# Patient Record
Sex: Male | Born: 1995 | Race: Black or African American | Hispanic: No | Marital: Single | Smoking: Never smoker
Health system: Southern US, Community
[De-identification: ages and names within clinical notes are randomized; demographics above are authoritative.]

## PROBLEM LIST (undated history)

## (undated) DIAGNOSIS — J302 Other seasonal allergic rhinitis: Secondary | ICD-10-CM

## (undated) DIAGNOSIS — K219 Gastro-esophageal reflux disease without esophagitis: Secondary | ICD-10-CM

## (undated) DIAGNOSIS — T7840XA Allergy, unspecified, initial encounter: Secondary | ICD-10-CM

## (undated) HISTORY — DX: Other seasonal allergic rhinitis: J30.2

## (undated) HISTORY — DX: Allergy, unspecified, initial encounter: T78.40XA

## (undated) HISTORY — PX: NO PAST SURGERIES: SHX2092

## (undated) HISTORY — DX: Gastro-esophageal reflux disease without esophagitis: K21.9

---

## 2017-10-11 ENCOUNTER — Emergency Department (HOSPITAL_COMMUNITY)
Admission: EM | Admit: 2017-10-11 | Discharge: 2017-10-11 | Disposition: A | Discharge status: Home / Self Care | Payer: BLUE CROSS/BLUE SHIELD | Attending: Emergency Medicine | Admitting: Emergency Medicine

## 2017-10-11 ENCOUNTER — Other Ambulatory Visit: Payer: Self-pay

## 2017-10-11 ENCOUNTER — Encounter (HOSPITAL_COMMUNITY): Payer: Self-pay

## 2017-10-11 DIAGNOSIS — J392 Other diseases of pharynx: Secondary | ICD-10-CM

## 2017-10-11 DIAGNOSIS — R0989 Other specified symptoms and signs involving the circulatory and respiratory systems: Secondary | ICD-10-CM | POA: Diagnosis present

## 2017-10-11 MED ORDER — OMEPRAZOLE 20 MG PO CPDR: 20.0000 mg | DELAYED_RELEASE_CAPSULE | Freq: Every day | ORAL | 0 refills | Status: DC

## 2017-10-11 MED ORDER — GI COCKTAIL ~~LOC~~
30.0000 mL | Freq: Once | ORAL | Status: AC
  Administered 2017-10-11: 30 mL via ORAL
  Filled 2017-10-11: qty 30

## 2017-10-11 MED ORDER — ONDANSETRON 4 MG PO TBDP: 4.0000 mg | ORAL_TABLET | Freq: Three times a day (TID) | ORAL | 0 refills | Status: DC | PRN

## 2017-10-11 NOTE — ED Notes (Signed)
One touch patient. See provider assessment notes.

## 2017-10-11 NOTE — ED Triage Notes (Signed)
Per Pt, Pt is coming from home with complaints of chip being stuck in his throat. Reports it getting stuck last Saturday and then the pain went away, but it has returned.

## 2017-10-11 NOTE — Discharge Instructions (Signed)
Take Zofran as needed for nausea. Take Prilosec as needed for reflux and heartburn. Follow[facility] for [facility]. Return to ED for worsening symptoms, trouble breathing, trouble swallowing, increased vomiting, or chest pain.

## 2017-10-11 NOTE — ED Provider Notes (Signed)
[facility] Provider [facility]   CSN: [id number] Arrival date & time: 10/11/17  1028     History   Chief Complaint Chief Complaint  Patient presents with  . Foreign Body in Throat    HPI Gary Boyd is a 21 y.o. male with no significant past medical history, who presents to ED for evaluation of foreign body sensation in throat for the past week.  He states that 1 week ago he was eating corn chips and felt like there was a small piece stuck in his throat.  Since then his symptoms have improved but then returned since last night.  He has not eating anything sharp or hard since then.  He states that he is able to drink liquids.  He also reports intermittent regurgitation and heartburn sensation.  He has not had any over-the-counter medications to help with symptoms.  He denies any previous history of similar symptoms, history of endoscopies, bowel changes, fever, trouble breathing, trouble swallowing, drooling.  HPI  History reviewed. No pertinent past medical history.  There are no active problems to display for this patient.   History reviewed. No pertinent surgical history.     Home Medications    Prior to Admission medications   Medication Sig Start Date End Date Taking? Authorizing Provider  omeprazole (PRILOSEC) 20 MG capsule Take 1 capsule (20 mg total) by mouth daily. 10/11/17   Rolande Moe, PA-C  ondansetron (ZOFRAN ODT) 4 MG disintegrating tablet Take 1 tablet (4 mg total) by mouth every 8 (eight) hours as needed for nausea or vomiting. 10/11/17   Dietrich PatesKhatri, Slade Pierpoint, PA-C    Family History No family history on file.  Social History Social History   Tobacco Use  . Smoking status: Never Smoker  . Smokeless tobacco: Never Used  Substance Use Topics  . Alcohol use: Yes    Frequency: Never  . Drug use: No     Allergies   Patient has no known allergies.   Review of Systems Review of Systems  Constitutional: Negative for  chills and fever.  HENT: Negative for drooling, facial swelling, sore throat and trouble swallowing.   Respiratory: Negative for choking and shortness of breath.   Gastrointestinal: Positive for nausea. Negative for vomiting.       + Foreign body sensation in throat + Reflux/heartburn     Physical Exam Updated Vital Signs BP 128/86 (BP Location: Right Arm)   Pulse 66   Temp 99 F (37.2 C) (Oral)   Resp 18   Ht 5\' 5"  (1.651 m)   Wt 80.7 kg (178 lb)   SpO2 100%   BMI 29.62 kg/m   Physical Exam  Constitutional: He appears well-developed and well-nourished. No distress.  Nontoxic appearing and in no acute distress.  Speaking in complete sentences with no signs of airway compromise or respiratory distress.  HENT:  Head: Normocephalic and atraumatic.  Mouth/Throat: Uvula is midline. No oral lesions. No dental abscesses. No posterior oropharyngeal edema or posterior oropharyngeal erythema. No tonsillar exudate.  Eyes: Conjunctivae and EOM are normal. No scleral icterus.  Neck: Normal range of motion.  Cardiovascular: Normal rate, regular rhythm and normal heart sounds.  Pulmonary/Chest: Effort normal and breath sounds normal. No respiratory distress.  Abdominal: Soft. Bowel sounds are normal. There is no tenderness.  Neurological: He is alert.  Skin: No rash noted. He is not diaphoretic.  Psychiatric: He has a normal mood and affect.  Nursing note and vitals reviewed.    ED  Treatments / Results  Labs (all labs ordered are listed, but only abnormal results are displayed) Labs Reviewed - No data to display  EKG  EKG Interpretation None       Radiology No results found.  Procedures Procedures (including critical care time)  Medications Ordered in ED Medications  gi cocktail (Maalox,Lidocaine,Donnatal) (30 mLs Oral Given 10/11/17 1219)     Initial Impression / Assessment and Plan / ED Course  I have reviewed the triage vital signs and the nursing  notes.  Pertinent labs & imaging results that were available during my care of the patient were reviewed by me and considered in my medical decision making (see chart for details).     Patient presents to ED for evaluation of foreign body sensation in throat, as well as reflux.  States sensation has been intermittent for about a week.  On physical exam patient is overall well-appearing.  He is speaking in complete sentences with no signs of respiratory distress or airway compromise.  SpO2 is 100% on room air. He is able to tolerate p.o. intake here with no problems.  Patient reports complete resolution of his symptoms with a GI cocktail.  Since his symptoms have been going on for about a week, I suspect that his symptoms are due to esophageal irritation rather than a foreign body considering the timeline and is ability to tolerate PO intake.  Will discharge home with Zofran to be taken as needed for nausea as well as medications for reflux.  [facility]  Final Clinical Impressions(s) / ED Diagnoses   Final diagnoses:  Throat irritation    ED Discharge Orders        Ordered    ondansetron (ZOFRAN ODT) 4 MG disintegrating tablet  Every 8 hours PRN     10/11/17 1257    omeprazole (PRILOSEC) 20 MG capsule  Daily     10/11/17 1257       Dietrich PatesKhatri, Natori Gudino, PA-C 10/11/17 1306    Gerhard MunchLockwood, Robert, MD 10/13/17 [phone]

## 2018-01-01 ENCOUNTER — Ambulatory Visit (INDEPENDENT_AMBULATORY_CARE_PROVIDER_SITE_OTHER): Payer: BLUE CROSS/BLUE SHIELD | Admitting: Physician Assistant

## 2018-01-01 ENCOUNTER — Encounter: Payer: Self-pay | Admitting: Physician Assistant

## 2018-01-01 ENCOUNTER — Encounter: Payer: Self-pay | Admitting: Internal Medicine

## 2018-01-01 VITALS — BP 122/72 | HR 79 | Temp 97.2°F | Resp 17 | Ht 69.5 in | Wt 180.0 lb

## 2018-01-01 DIAGNOSIS — R1310 Dysphagia, unspecified: Secondary | ICD-10-CM

## 2018-01-01 NOTE — Progress Notes (Signed)
   Gary Boyd  MRN: [id number] DOB: [birth year]  PCP: Patient, No Pcp Per  Subjective:  Pt is a pleasant 22 year old male who complains of difficulty swallowing. The dysphagia occurs with solids. Symptoms have been present for approximately 3 months. The symptoms are unchanged. The dysphagia has been persistent and has not been progressive. Feels like food gets stuck at the upper/center of his chest.  He c/o no indigestion or heartburn.  He denies melena, hematochezia, hematemesis, and coffee ground emesis.  This has been associated with dysphagia, midespigastric pain and shortness of breath. He endorses a few episodes of undigested food "coming back up after a few hours". He denies difficulty initiating swallowing, weight loss, abdominal bloating, belching and eructation, choking on food, cough, early satiety, fullness after meals, heartburn, hoarseness, nausea, need to clear throat frequently and unexpected weight loss. He has never had this problem before.   Review of Systems  Constitutional: Negative for appetite change, chills, diaphoresis, fatigue and unexpected weight change.  HENT: Positive for trouble swallowing.   Gastrointestinal: Positive for vomiting. Negative for abdominal pain, diarrhea and nausea.  Neurological: Negative for speech difficulty.    There are no active problems to display for this patient.   No current outpatient medications on file prior to visit.   No current facility-administered medications on file prior to visit.     No Known Allergies   Objective:  BP 122/72   Pulse 79   Temp (!) 97.2 F (36.2 C) (Oral)   Resp 17   Ht 5' 9.5" (1.765 m)   Wt 180 lb (81.6 kg)   SpO2 98%   BMI 26.20 kg/m   Physical Exam  Constitutional: He is oriented to person, place, and time and well-developed, well-nourished, and in no distress. No distress.  Neck: Normal range of motion and full passive range of motion without pain. Neck supple.  Cardiovascular:  Normal rate, regular rhythm and normal heart sounds.  Pulmonary/Chest: Effort normal.  Abdominal: Soft. Bowel sounds are normal. There is no tenderness.  Neurological: He is alert and oriented to person, place, and time. GCS score is 15.  Skin: Skin is warm and dry.  Psychiatric: Mood, memory, affect and judgment normal.  Vitals reviewed.   Assessment and Plan :  1. Dysphagia, unspecified type - Ambulatory referral to Gastroenterology - Pt c/o difficulty swallowing for the past 3 months. Symptoms are not progressive and I do not appreciate any red flag symptoms. He was seen in the ED 10/11/2017 for FB sensation in throat where symptoms improved with GI cocktail. He was sent home with zofran. No imaging performed.  Plan to refer to GI for evaluation and work-up. I feel he would benefit from imaging studies. Advised pt to keep food/symptom log and avoid foods which significantly worsen symptoms.   Marco CollieWhitney Dolorez Jeffrey, PA-C  [facility] at [facility] 01/01/2018 1:53 PM

## 2018-01-01 NOTE — Patient Instructions (Addendum)
You will receive a phone call to schedule an appointment with GI.  If you can, try to start keeping a log to note specific information regarding your symptoms (ie specific foods causing symptoms, time of day, etc) This will help the provider you see.   Thank you for coming in today. I hope you feel we met your needs.  Feel free to call PCP if you have any questions or further requests.  Please consider signing up for MyChart if you do not already have it, as this is a great way to communicate with me.  Best,  Whitney McVey, PA-C'   IF you received an x-ray today, you will receive an invoice from [facility]. Please contact [facility] at [phone] with questions or concerns regarding your invoice.   IF you received labwork today, you will receive an invoice from [facility]. Please contact [facility] at [phone] with questions or concerns regarding your invoice.   Our billing staff will not be able to assist you with questions regarding bills from these companies.  You will be contacted with the lab results as soon as they are available. The fastest way to get your results is to activate your My Chart account. Instructions are located on the last page of this paperwork. If you have not heard from [place] regarding the results in 2 weeks, please contact this office.

## 2018-01-05 ENCOUNTER — Emergency Department (HOSPITAL_COMMUNITY)
Admission: EM | Admit: 2018-01-05 | Discharge: 2018-01-05 | Disposition: A | Discharge status: Home / Self Care | Payer: BLUE CROSS/BLUE SHIELD | Attending: Emergency Medicine | Admitting: Emergency Medicine

## 2018-01-05 ENCOUNTER — Encounter (HOSPITAL_COMMUNITY): Payer: Self-pay

## 2018-01-05 ENCOUNTER — Other Ambulatory Visit: Payer: Self-pay

## 2018-01-05 ENCOUNTER — Emergency Department (HOSPITAL_COMMUNITY): Payer: BLUE CROSS/BLUE SHIELD

## 2018-01-05 DIAGNOSIS — R079 Chest pain, unspecified: Secondary | ICD-10-CM | POA: Diagnosis present

## 2018-01-05 DIAGNOSIS — R0789 Other chest pain: Secondary | ICD-10-CM | POA: Diagnosis not present

## 2018-01-05 LAB — CBC
HCT: 42.3 % (ref 39.0–52.0)
HEMOGLOBIN: 14.3 g/dL (ref 13.0–17.0)
MCH: 30.1 pg (ref 26.0–34.0)
MCHC: 33.8 g/dL (ref 30.0–36.0)
MCV: 89.1 fL (ref 78.0–100.0)
Platelets: 209 10*3/uL (ref 150–400)
RBC: 4.75 MIL/uL (ref 4.22–5.81)
RDW: 12.3 % (ref 11.5–15.5)
WBC: 6.7 10*3/uL (ref 4.0–10.5)

## 2018-01-05 LAB — BASIC METABOLIC PANEL
ANION GAP: 11 (ref 5–15)
BUN: 7 mg/dL (ref 6–20)
CHLORIDE: 102 mmol/L (ref 101–111)
CO2: 24 mmol/L (ref 22–32)
Calcium: 10 mg/dL (ref 8.9–10.3)
Creatinine, Ser: 0.93 mg/dL (ref 0.61–1.24)
GFR calc non Af Amer: >60 mL/min (ref >60)
GLUCOSE: 89 mg/dL (ref 65–99)
POTASSIUM: 3.7 mmol/L (ref 3.5–5.1)
Sodium: 137 mmol/L (ref 135–145)

## 2018-01-05 LAB — I-STAT TROPONIN, ED: TROPONIN I, POC: 0 ng/mL (ref 0.00–0.08)

## 2018-01-05 MED ORDER — GI COCKTAIL ~~LOC~~
30.0000 mL | Freq: Once | ORAL | Status: AC
  Administered 2018-01-05: 30 mL via ORAL
  Filled 2018-01-05: qty 30

## 2018-01-05 MED ORDER — FAMOTIDINE 20 MG PO TABS: 20.0000 mg | ORAL_TABLET | Freq: Two times a day (BID) | ORAL | 0 refills | Status: DC

## 2018-01-05 MED ORDER — ONDANSETRON 4 MG PO TBDP: 4.0000 mg | ORAL_TABLET | Freq: Three times a day (TID) | ORAL | 0 refills | Status: DC | PRN

## 2018-01-05 MED ORDER — ONDANSETRON 4 MG PO TBDP
4.0000 mg | ORAL_TABLET | Freq: Once | ORAL | Status: AC
  Administered 2018-01-05: 4 mg via ORAL
  Filled 2018-01-05: qty 1

## 2018-01-05 NOTE — ED Triage Notes (Signed)
Pt states for the past month he has been having chest congestion, central pain, nasal congestion and feeling SOB, vomited x 3.

## 2018-01-05 NOTE — Discharge Instructions (Signed)
Start the pepcid daily, reserve zofran use only if you feel really nauseated. Follow-up with your primary care doctor. Return to the ED for new or worsening symptoms.

## 2018-01-05 NOTE — ED Provider Notes (Signed)
[facility]   CSN: [id number] Arrival date & time: 01/05/18  0451     History   Chief Complaint Chief Complaint  Patient presents with  . Chest Pain    HPI Gary Boyd is a [age] y.o. male.  The history is provided by the patient and medical records.  Chest Pain   Associated symptoms include nausea.    22 year old male presenting to the ED with chest pain.  Reports for the past month he has had some chest congestion, central chest pain that he describes as burning as well as some intermittent shortness of breath.  States whenever he eats he feels like stuff gets "hung" in his throat.  States he was seen in the ED for this about a month ago was started on medication but once he stopped the medicine his symptoms all returned.  This mostly occurs with solid food, less so with liquids.  He has not had any issues swallowing his saliva.  He has not had any fever or chills.  Does report some intermittent episodes of emesis when he gets that sensation in his throat.  He has no known cardiac history.  He is not a smoker.  No significant family cardiac history.  History reviewed. No pertinent past medical history.  There are no active problems to display for this patient.   History reviewed. No pertinent surgical history.     Home Medications    Prior to Admission medications   Not on File    Family History No family history on file.  Social History Social History   Tobacco Use  . Smoking status: Never Smoker  . Smokeless tobacco: Never Used  Substance Use Topics  . Alcohol use: Yes    Frequency: Never  . Drug use: No     Allergies   Patient has no known allergies.   Review of Systems Review of Systems  Cardiovascular: Positive for chest pain.  Gastrointestinal: Positive for nausea.  All other systems reviewed and are negative.    Physical Exam Updated Vital Signs BP 124/76   Pulse 73   Temp 97.6 F  (36.4 C) (Oral)   Resp 18   Ht 5\' 9"  (1.753 m)   Wt 81.6 kg (180 lb)   SpO2 100%   BMI 26.58 kg/m   Physical Exam  Constitutional: He is oriented to person, place, and time. He appears well-developed and well-nourished.  HENT:  Head: Normocephalic and atraumatic.  Mouth/Throat: Oropharynx is clear and moist.  Airway patent, handling secretions well, normal phonation without stridor; no tonsillar edema or exudates  Eyes: Conjunctivae and EOM are normal. Pupils are equal, round, and reactive to light.  Neck: Normal range of motion.  Cardiovascular: Normal rate, regular rhythm and normal heart sounds.  Pulmonary/Chest: Effort normal and breath sounds normal. He has no decreased breath sounds. He has no wheezes.  Abdominal: Soft. Bowel sounds are normal.  Musculoskeletal: Normal range of motion.  Neurological: He is alert and oriented to person, place, and time.  Skin: Skin is warm and dry.  Psychiatric: He has a normal mood and affect.  Nursing note and vitals reviewed.    ED Treatments / Results  Labs (all labs ordered are listed, but only abnormal results are displayed) Labs Reviewed  BASIC METABOLIC PANEL  CBC  I-STAT TROPONIN, ED    EKG  EKG Interpretation None       Radiology Dg Chest 2 View  Result Date: 01/05/2018 CLINICAL  DATA:  22 y/o M; chest congestion, central chest pain, shortness of breath, and multiple episodes of vomiting for the past month. EXAM: CHEST  2 VIEW COMPARISON:  None. FINDINGS: The heart size and mediastinal contours are within normal limits. Both lungs are clear. The visualized skeletal structures are unremarkable. IMPRESSION: No acute pulmonary process identified. Electronically Signed   By: Mitzi Hansen M.D.   On: 01/05/2018 06:02    Procedures Procedures (including critical care time)  Medications Ordered in ED Medications  ondansetron (ZOFRAN-ODT) disintegrating tablet 4 mg (4 mg Oral Given 01/05/18 0618)  gi cocktail  (Maalox,Lidocaine,Donnatal) (30 mLs Oral Given 01/05/18 1610)     Initial Impression / Assessment and Plan / ED Course  I have reviewed the triage vital signs and the nursing notes.  Pertinent labs & imaging results that were available during my care of the patient were reviewed by me and considered in my medical decision making (see chart for details).  22 y.o. M here with chest pain.  Sensation of food getting "hung" up in the throat over the past few weeks as well as burning sensation in the chest.  Was on meds for this before and better controlled, symptoms returned once stopped.  EKG without acute ischemic changes.  Labs reassuring.  CXR clear.  Patient treated here with zofran and GI cocktail and reports feeling better.  Suspect his symptoms related to GERD.  He has not had any issues tolerating PO here.  No findings on exam or x-ray to suggest acute esophageal obstruction.  Will re-start pepcid, discussed limiting spicy/acidic foods.  Close follow-up with PCP.  Discussed plan with patient, he acknowledged understanding and agreed with plan of care.  Return precautions given for new or worsening symptoms.  Final Clinical Impressions(s) / ED Diagnoses   Final diagnoses:  Atypical chest pain    ED Discharge Orders        Ordered    famotidine (PEPCID) 20 MG tablet  2 times daily     01/05/18 0643    ondansetron (ZOFRAN ODT) 4 MG disintegrating tablet  Every 8 hours PRN     01/05/18 0644       Garlon Hatchet, PA-C 01/05/18 9604    Azalia Bilis, MD 01/05/18 [phone]

## 2018-01-19 ENCOUNTER — Encounter (HOSPITAL_COMMUNITY): Payer: Self-pay | Admitting: *Deleted

## 2018-01-19 ENCOUNTER — Emergency Department (HOSPITAL_COMMUNITY)
Admission: EM | Admit: 2018-01-19 | Discharge: 2018-01-19 | Disposition: A | Discharge status: Home / Self Care | Payer: BLUE CROSS/BLUE SHIELD | Attending: Emergency Medicine | Admitting: Emergency Medicine

## 2018-01-19 ENCOUNTER — Other Ambulatory Visit: Payer: Self-pay

## 2018-01-19 DIAGNOSIS — R0789 Other chest pain: Secondary | ICD-10-CM

## 2018-01-19 DIAGNOSIS — K209 Esophagitis, unspecified: Secondary | ICD-10-CM | POA: Diagnosis not present

## 2018-01-19 DIAGNOSIS — K2100 Gastro-esophageal reflux disease with esophagitis, without bleeding: Secondary | ICD-10-CM

## 2018-01-19 DIAGNOSIS — K219 Gastro-esophageal reflux disease without esophagitis: Secondary | ICD-10-CM | POA: Diagnosis present

## 2018-01-19 LAB — COMPREHENSIVE METABOLIC PANEL
ALT: 14 U/L — ABNORMAL LOW (ref 17–63)
ANION GAP: 9 (ref 5–15)
AST: 16 U/L (ref 15–41)
Albumin: 4.4 g/dL (ref 3.5–5.0)
Alkaline Phosphatase: 54 U/L (ref 38–126)
BILIRUBIN TOTAL: 1.1 mg/dL (ref 0.3–1.2)
BUN: <5 mg/dL — ABNORMAL LOW (ref 6–20)
CHLORIDE: 99 mmol/L — AB (ref 101–111)
CO2: 25 mmol/L (ref 22–32)
Calcium: 9 mg/dL (ref 8.9–10.3)
Creatinine, Ser: 0.93 mg/dL (ref 0.61–1.24)
Glucose, Bld: 86 mg/dL (ref 65–99)
POTASSIUM: 3.1 mmol/L — AB (ref 3.5–5.1)
Sodium: 133 mmol/L — ABNORMAL LOW (ref 135–145)
Total Protein: 6.6 g/dL (ref 6.5–8.1)

## 2018-01-19 LAB — URINALYSIS, ROUTINE W REFLEX MICROSCOPIC
Bilirubin Urine: NEGATIVE
GLUCOSE, UA: NEGATIVE mg/dL
Hgb urine dipstick: NEGATIVE
KETONES UR: NEGATIVE mg/dL
LEUKOCYTES UA: NEGATIVE
Nitrite: NEGATIVE
PH: 7 (ref 5.0–8.0)
Protein, ur: NEGATIVE mg/dL
Specific Gravity, Urine: 1 — ABNORMAL LOW (ref 1.005–1.030)

## 2018-01-19 LAB — CBC
HEMATOCRIT: 37.7 % — AB (ref 39.0–52.0)
HEMOGLOBIN: 12.5 g/dL — AB (ref 13.0–17.0)
MCH: 29.1 pg (ref 26.0–34.0)
MCHC: 33.2 g/dL (ref 30.0–36.0)
MCV: 87.9 fL (ref 78.0–100.0)
Platelets: 217 10*3/uL (ref 150–400)
RBC: 4.29 MIL/uL (ref 4.22–5.81)
RDW: 12.1 % (ref 11.5–15.5)
WBC: 5.3 10*3/uL (ref 4.0–10.5)

## 2018-01-19 LAB — LIPASE, BLOOD: LIPASE: 27 U/L (ref 11–51)

## 2018-01-19 MED ORDER — PANTOPRAZOLE SODIUM 40 MG PO TBEC
40.0000 mg | DELAYED_RELEASE_TABLET | Freq: Once | ORAL | Status: AC
  Administered 2018-01-19: 40 mg via ORAL
  Filled 2018-01-19: qty 1

## 2018-01-19 MED ORDER — FAMOTIDINE 20 MG PO TABS: 20.0000 mg | ORAL_TABLET | Freq: Two times a day (BID) | ORAL | 0 refills | Status: DC

## 2018-01-19 MED ORDER — GI COCKTAIL ~~LOC~~
30.0000 mL | Freq: Once | ORAL | Status: AC
  Administered 2018-01-19: 30 mL via ORAL
  Filled 2018-01-19: qty 30

## 2018-01-19 MED ORDER — SUCRALFATE 1 G PO TABS: 1.0000 g | ORAL_TABLET | Freq: Two times a day (BID) | ORAL | 0 refills | Status: DC

## 2018-01-19 MED ORDER — KETOROLAC TROMETHAMINE 60 MG/2ML IM SOLN
60.0000 mg | Freq: Once | INTRAMUSCULAR | Status: AC
  Administered 2018-01-19: 60 mg via INTRAMUSCULAR
  Filled 2018-01-19: qty 2

## 2018-01-19 NOTE — Discharge Instructions (Signed)
Return here as needed.  Follow-up with the GI doctor provided.  Your testing here today did not show any significant abnormality

## 2018-01-19 NOTE — ED Triage Notes (Signed)
Pt reports pancreatic pain while pointing to chest of chest and upper abd pain. Hx of acid reflux. Reports drinking a tea tonight prior to episode

## 2018-01-19 NOTE — ED Provider Notes (Signed)
[facility]   CSN: [id number] Arrival date & time: 01/19/18  0120     History   Chief Complaint Chief Complaint  Patient presents with  . Gastroesophageal Reflux    HPI Bard HerbertDallas Deshmukh is a 22 y.o. male.  HPI  Patient presents to the emergency department with chest discomfort that started after drinking some tea.  The patient states that chest discomfort that started after drinking some tea.  Patient states that he did not take any medications prior to arrival.  Patient states that he has had similar episodes in the past.  He states certain movements make the pain worse.  Patient states the pain is somewhat sharp/burning.  The patient denies chest pain, shortness of breath, headache,blurred vision, neck pain, fever, cough, weakness, numbness, dizziness, anorexia, edema, abdominal pain, nausea, vomiting, diarrhea, rash, back pain, dysuria, hematemesis, bloody stool, near syncope, or syncope.    History reviewed. No pertinent past medical history.  There are no active problems to display for this patient.   History reviewed. No pertinent surgical history.     Home Medications    Prior to Admission medications   Medication Sig Start Date End Date Taking? Authorizing Provider  famotidine (PEPCID) 20 MG tablet Take 1 tablet (20 mg total) by mouth 2 (two) times daily. 01/05/18   Garlon HatchetSanders, Lisa M, PA-C  ondansetron (ZOFRAN ODT) 4 MG disintegrating tablet Take 1 tablet (4 mg total) by mouth every 8 (eight) hours as needed for nausea. 01/05/18   Garlon HatchetSanders, Lisa M, PA-C    Family History No family history on file.  Social History Social History   Tobacco Use  . Smoking status: Never Smoker  . Smokeless tobacco: Never Used  Substance Use Topics  . Alcohol use: Yes    Frequency: Never  . Drug use: No     Allergies   Patient has no known allergies.   Review of Systems Review of Systems All other systems negative except  as documented in the HPI. All pertinent positives and negatives as reviewed in the HPI.  Physical Exam Updated Vital Signs BP 137/80 (BP Location: Right Arm)   Pulse (!) 57   Temp (!) 97.5 F (36.4 C) (Oral)   Resp 16   Ht 5\' 9"  (1.753 m)   Wt 81.6 kg (180 lb)   SpO2 100%   BMI 26.58 kg/m    Physical Exam  Constitutional: He is oriented to person, place, and time. He appears well-developed and well-nourished. No distress.  HENT:  Head: Normocephalic and atraumatic.  Mouth/Throat: Oropharynx is clear and moist.  Eyes: Pupils are equal, round, and reactive to light.  Neck: Normal range of motion. Neck supple.  Cardiovascular: Normal rate, regular rhythm and normal heart sounds. Exam reveals no gallop and no friction rub.  No murmur heard. Pulmonary/Chest: Effort normal and breath sounds normal. No respiratory distress. He has no wheezes. He exhibits tenderness.  Abdominal: Soft. Bowel sounds are normal. He exhibits no distension. There is no tenderness.  Neurological: He is alert and oriented to person, place, and time. He exhibits normal muscle tone. Coordination normal.  Skin: Skin is warm and dry. Capillary refill takes less than 2 seconds. No rash noted. No erythema.  Psychiatric: He has a normal mood and affect. His behavior is normal.  Nursing note and vitals reviewed.    ED Treatments / Results  Labs (all labs ordered are listed, but only abnormal results are displayed) Labs Reviewed  COMPREHENSIVE  METABOLIC PANEL - Abnormal; Notable for the following components:      Result Value   Sodium 133 (*)    Potassium 3.1 (*)    Chloride 99 (*)    BUN <5 (*)    ALT 14 (*)    All other components within normal limits  CBC - Abnormal; Notable for the following components:   Hemoglobin 12.5 (*)    HCT 37.7 (*)    All other components within normal limits  URINALYSIS, ROUTINE W REFLEX MICROSCOPIC - Abnormal; Notable for the following components:   Color, Urine COLORLESS (*)     Specific Gravity, Urine 1.000 (*)    All other components within normal limits  LIPASE, BLOOD    EKG  EKG Interpretation None       Radiology No results found.  Procedures Procedures (including critical care time)  Medications Ordered in ED Medications  gi cocktail (Maalox,Lidocaine,Donnatal) (30 mLs Oral Given 01/19/18 1137)  pantoprazole (PROTONIX) EC tablet 40 mg (40 mg Oral Given 01/19/18 1137)  ketorolac (TORADOL) injection 60 mg (60 mg Intramuscular Given 01/19/18 1137)     Initial Impression / Assessment and Plan / ED Course  I have reviewed the triage vital signs and the nursing notes.  Pertinent labs & imaging results that were available during my care of the patient were reviewed by me and considered in my medical decision making (see chart for details).     I feel that the patient may have multiple issues but most likely seems that he is having issues with musculoskeletal chest pain patient has no risk factors for PE.  The patient has had a history of GERD in the past that is because some of the similar symptoms other than the chest discomfort with movements.  I have advised the patient he will need to follow-up with GI.  Patient is advised to follow-up here for any worsening in his condition.  Final Clinical Impressions(s) / ED Diagnoses   Final diagnoses:  None    ED Discharge Orders    None      Charlestine Night, PA-C 01/19/18 1219  Loren Racer, MD 01/20/18 [phone]

## 2018-02-11 ENCOUNTER — Ambulatory Visit (INDEPENDENT_AMBULATORY_CARE_PROVIDER_SITE_OTHER): Payer: BLUE CROSS/BLUE SHIELD | Admitting: Internal Medicine

## 2018-02-11 ENCOUNTER — Encounter: Payer: Self-pay | Admitting: Internal Medicine

## 2018-02-11 VITALS — BP 108/68 | HR 64 | Ht 69.0 in | Wt 162.1 lb

## 2018-02-11 DIAGNOSIS — R079 Chest pain, unspecified: Secondary | ICD-10-CM | POA: Diagnosis not present

## 2018-02-11 DIAGNOSIS — K219 Gastro-esophageal reflux disease without esophagitis: Secondary | ICD-10-CM

## 2018-02-11 DIAGNOSIS — R1013 Epigastric pain: Secondary | ICD-10-CM | POA: Diagnosis not present

## 2018-02-11 DIAGNOSIS — F411 Generalized anxiety disorder: Secondary | ICD-10-CM

## 2018-02-11 DIAGNOSIS — R1310 Dysphagia, unspecified: Secondary | ICD-10-CM

## 2018-02-11 MED ORDER — PANTOPRAZOLE SODIUM 40 MG PO TBEC: 40.0000 mg | DELAYED_RELEASE_TABLET | Freq: Every day | ORAL | 11 refills | Status: DC

## 2018-02-11 NOTE — Progress Notes (Signed)
HISTORY OF PRESENT ILLNESS:  Gary Boyd is a 22 y.o. male , arts student from [employer], who is self-referred with a myriad of complaints including chest pain, abdominal pain, globus sensation, dysphagia, difficulty breathing and sleeping at night. Patient reports that he has had symptoms for 4-6 months. For this problem he was evaluated at [facility] primary care 01/01/2018. Ambulatory GI evaluation recommended. I have reviewed that encounter. Subsequently seen in the emergency room with chest pain. 01/19/2018. Reviewed. CBC revealed hemoglobin 12.5. Comprehensive metabolic panel was normal except for mild decrease in sodium and potassium. Patient does describe some classic reflux symptoms. Also atypical dyspepsia. Vague dysphagia. He feels that his problem started after eating a corn ship which scratched his throat and may have led to an infection, he thinks. He does have issues with anxiety and panic attacks. He has started omeprazole 20 mg daily. More classic type reflux symptoms and epigastric burning have improved somewhat. He denies weight loss. Occasional loose bowels. Some regurgitation  REVIEW OF SYSTEMS:  All non-GI ROS negative unless otherwise stated in the history of present illness except for anxiety  Past Medical History:  Diagnosis Date  . Seasonal allergies     History reviewed. No pertinent surgical history.  Social History Gary Boyd  reports that he has never smoked. He has never used smokeless tobacco. He reports that he drinks alcohol. He reports that he does not use drugs.  family history includes Stroke in his maternal grandmother.  No Known Allergies     PHYSICAL EXAMINATION: Vital signs: BP 108/68   Pulse 64   Ht _0  (1.753 m)   Wt 162 lb 2 oz (73.5 kg)   BMI 23.94 kg/m   Constitutional: generally well-appearing, no acute distress Psychiatric: alert and oriented x3, cooperative Eyes: extraocular movements intact, anicteric, conjunctiva  pink Mouth: oral pharynx moist, no lesions Neck: supple no lymphadenopathy Cardiovascular: heart regular rate and rhythm, no murmur Lungs: clear to auscultation bilaterally Abdomen: soft, nontender, nondistended, no obvious ascites, no peritoneal signs, normal bowel sounds, no organomegaly Rectal:omitted Extremities: no clubbing cyanosis or lower extremity edema bilaterally Skin: no lesions on visible extremities Neuro: No focal deficits. Cranial nerves intact  ASSESSMENT:  #1. Dyspepsia. Possible mild GERD. Suspect functional component #2. Atypical dysphagia with chest pain #3. Anxiety   PLAN:  #1. Prescribe pantoprazole 40 mg daily. Electronic prescription submitted #2. Strict adherence to reflux precautions #3. Diagnostic upper endoscopy to evaluate dysphagia and dyspeptic symptoms.The nature of the procedure, as well as the risks, benefits, and alternatives were carefully and thoroughly reviewed with the patient. Ample time for discussion and questions allowed. The patient understood, was satisfied, and agreed to proceed. #4. If upper endoscopy unrevealing and symptoms persist despite PPI, including anxiety issues, then refer back to his PCP to address these problems.

## 2018-02-11 NOTE — Patient Instructions (Signed)
We have sent the following medications to your pharmacy for you to pick up at your convenience:  Pantoprazole  You have been scheduled for an endoscopy. Please follow written instructions given to you at your visit today. If you use inhalers (even only as needed), please bring them with you on the day of your procedure. Your physician has requested that you go to www.startemmi.com and enter the access code given to you at your visit today. This web site gives a general overview about your procedure. However, you should still follow specific instructions given to you by our office regarding your preparation for the procedure.   

## 2018-04-10 ENCOUNTER — Encounter: Payer: BLUE CROSS/BLUE SHIELD | Admitting: Internal Medicine

## 2018-05-16 ENCOUNTER — Encounter: Payer: Self-pay | Admitting: Internal Medicine

## 2018-05-16 ENCOUNTER — Ambulatory Visit (AMBULATORY_SURGERY_CENTER): Payer: BLUE CROSS/BLUE SHIELD | Admitting: Internal Medicine

## 2018-05-16 VITALS — BP 119/70 | HR 68 | Temp 99.1°F | Resp 10 | Ht 69.0 in | Wt 162.0 lb

## 2018-05-16 DIAGNOSIS — R079 Chest pain, unspecified: Secondary | ICD-10-CM | POA: Diagnosis not present

## 2018-05-16 DIAGNOSIS — R1310 Dysphagia, unspecified: Secondary | ICD-10-CM | POA: Diagnosis not present

## 2018-05-16 DIAGNOSIS — K219 Gastro-esophageal reflux disease without esophagitis: Secondary | ICD-10-CM

## 2018-05-16 MED ORDER — SODIUM CHLORIDE 0.9 % IV SOLN: 500.0000 mL | INTRAVENOUS | Status: DC

## 2018-05-16 NOTE — Progress Notes (Signed)
A/ox3 pleased with MAC, report to RN 

## 2018-05-16 NOTE — Patient Instructions (Signed)
YOU HAD AN ENDOSCOPIC PROCEDURE TODAY AT THE [facility]:   Refer to the procedure report that was given to you for any specific questions about what was found during the examination.  If the procedure report does not answer your questions, please call your gastroenterologist to clarify.  If you requested that your care partner not be given the details of your procedure findings, then the procedure report has been included in a sealed envelope for you to review at your convenience later.  YOU SHOULD EXPECT: Some feelings of bloating in the abdomen. Passage of more gas than usual.  Walking can help get rid of the air that was put into your GI tract during the procedure and reduce the bloating. If you had a lower endoscopy (such as a colonoscopy or flexible sigmoidoscopy) you may notice spotting of blood in your stool or on the toilet paper. If you underwent a bowel prep for your procedure, you may not have a normal bowel movement for a few days.  Please Note:  You might notice some irritation and congestion in your nose or some drainage.  This is from the oxygen used during your procedure.  There is no need for concern and it should clear up in a day or so.  SYMPTOMS TO REPORT IMMEDIATELY:   Following upper endoscopy (EGD)  Vomiting of blood or coffee ground material  New chest pain or pain under the shoulder blades  Painful or persistently difficult swallowing  New shortness of breath  Fever of 100F or higher  Black, tarry-looking stools  For urgent or emergent issues, a gastroenterologist can be reached at any hour by calling [phone].   DIET:  We do recommend a small meal at first, but then you may proceed to your regular diet.  Drink plenty of fluids but you should avoid alcoholic beverages for 24 hours.  ACTIVITY:  You should plan to take it easy for the rest of today and you should NOT DRIVE or use heavy machinery until tomorrow (because of the sedation medicines used  during the test).    FOLLOW UP: Our staff will call the number listed on your records the next business day following your procedure to check on you and address any questions or concerns that you may have regarding the information given to you following your procedure. If we do not reach you, we will leave a message.  However, if you are feeling well and you are not experiencing any problems, there is no need to return our call.  We will assume that you have returned to your regular daily activities without incident.  If any biopsies were taken you will be contacted by phone or by letter within the next 1-3 weeks.  Please call us at [phone] if you have not heard about the biopsies in 3 weeks.    SIGNATURES/CONFIDENTIALITY: You and/or your care partner have signed paperwork which will be entered into your electronic medical record.  These signatures attest to the fact that that the information above on your After Visit Summary has been reviewed and is understood.  Full responsibility of the confidentiality of this discharge information lies with you and/or your care-partner.   Thank you for allowing us to provide your healthcare today.  

## 2018-05-16 NOTE — Op Note (Signed)
[facility] Patient Name: Gary Boyd Procedure Date: 05/16/2018 11:05 AM MRN: [id number] Endoscopist: Wilhemina Bonito. Marina Goodell , MD Age: 22 Referring MD:  Date of Birth: 1996-05-18 Gender: Male Account #: [id number] Procedure:                Upper GI endoscopy Indications:              Functional Dyspepsia, Chest pain (non cardiac) Medicines:                Monitored Anesthesia Care Procedure:                Pre-Anesthesia Assessment:                           - Prior to the procedure, a History and Physical                            was performed, and patient medications and                            allergies were reviewed. The patient's tolerance of                            previous anesthesia was also reviewed. The risks                            and benefits of the procedure and the sedation                            options and risks were discussed with the patient.                            All questions were answered, and informed consent                            was obtained. Prior Anticoagulants: The patient has                            taken no previous anticoagulant or antiplatelet                            agents. ASA Grade Assessment: I - A normal, healthy                            patient. After reviewing the risks and benefits,                            the patient was deemed in satisfactory condition to                            undergo the procedure.                           After obtaining informed consent, the endoscope was  passed under direct vision. Throughout the                            procedure, the patient's blood pressure, pulse, and                            oxygen saturations were monitored continuously. The                            Endoscope was introduced through the mouth, and                            advanced to the second part of duodenum. The upper                            GI endoscopy was  accomplished without difficulty.                            The patient tolerated the procedure well. Scope In: Scope Out: Findings:                 The esophagus was normal.                           The stomach was normal.                           The examined duodenum was normal.                           The cardia and gastric fundus were normal on                            retroflexion. Complications:            No immediate complications. Estimated Blood Loss:     Estimated blood loss: none. Impression:               - Normal esophagus.                           - Normal stomach.                           - Normal examined duodenum.                           - No specimens collected. Recommendation:           - Patient has a contact number available for                            emergencies. The signs and symptoms of potential                            delayed complications were discussed with the  patient. Return to normal activities tomorrow.                            Written discharge instructions were provided to the                            patient.                           - Resume previous diet.                           - Continue present medications. Wilhemina BonitoJohn N. Marina GoodellPerry, MD 05/16/2018 11:25:46 AM This report has been signed electronically.

## 2018-05-19 ENCOUNTER — Telehealth: Payer: Self-pay | Admitting: *Deleted

## 2018-05-19 ENCOUNTER — Telehealth: Payer: Self-pay

## 2018-05-19 NOTE — Telephone Encounter (Signed)
Second post procedure phone call, no answer 

## 2018-05-19 NOTE — Telephone Encounter (Signed)
Left message on f/u call 

## 2018-11-19 ENCOUNTER — Other Ambulatory Visit: Payer: Self-pay

## 2018-11-19 ENCOUNTER — Encounter (HOSPITAL_COMMUNITY): Payer: Self-pay | Admitting: Emergency Medicine

## 2018-11-19 ENCOUNTER — Emergency Department (HOSPITAL_COMMUNITY): Payer: Self-pay

## 2018-11-19 ENCOUNTER — Observation Stay (HOSPITAL_COMMUNITY)
Admission: EM | Admit: 2018-11-19 | Discharge: 2018-11-21 | Disposition: A | Discharge status: Home / Self Care | Payer: Self-pay | Attending: Family Medicine | Admitting: Family Medicine

## 2018-11-19 ENCOUNTER — Observation Stay (HOSPITAL_BASED_OUTPATIENT_CLINIC_OR_DEPARTMENT_OTHER): Payer: Self-pay

## 2018-11-19 DIAGNOSIS — Z79899 Other long term (current) drug therapy: Secondary | ICD-10-CM | POA: Insufficient documentation

## 2018-11-19 DIAGNOSIS — R079 Chest pain, unspecified: Secondary | ICD-10-CM

## 2018-11-19 DIAGNOSIS — E876 Hypokalemia: Secondary | ICD-10-CM | POA: Insufficient documentation

## 2018-11-19 DIAGNOSIS — E440 Moderate protein-calorie malnutrition: Secondary | ICD-10-CM | POA: Insufficient documentation

## 2018-11-19 DIAGNOSIS — I300 Acute nonspecific idiopathic pericarditis: Secondary | ICD-10-CM

## 2018-11-19 DIAGNOSIS — K219 Gastro-esophageal reflux disease without esophagitis: Secondary | ICD-10-CM

## 2018-11-19 DIAGNOSIS — F419 Anxiety disorder, unspecified: Secondary | ICD-10-CM | POA: Insufficient documentation

## 2018-11-19 DIAGNOSIS — I319 Disease of pericardium, unspecified: Principal | ICD-10-CM | POA: Insufficient documentation

## 2018-11-19 DIAGNOSIS — Z6823 Body mass index (BMI) 23.0-23.9, adult: Secondary | ICD-10-CM | POA: Insufficient documentation

## 2018-11-19 DIAGNOSIS — J302 Other seasonal allergic rhinitis: Secondary | ICD-10-CM

## 2018-11-19 DIAGNOSIS — F329 Major depressive disorder, single episode, unspecified: Secondary | ICD-10-CM | POA: Insufficient documentation

## 2018-11-19 LAB — I-STAT CHEM 8, ED
BUN: 12 mg/dL (ref 6–20)
Calcium, Ion: 1.12 mmol/L — ABNORMAL LOW (ref 1.15–1.40)
Chloride: 91 mmol/L — ABNORMAL LOW (ref 98–111)
Creatinine, Ser: 0.9 mg/dL (ref 0.61–1.24)
Glucose, Bld: 113 mg/dL — ABNORMAL HIGH (ref 70–99)
HCT: 41 % (ref 39.0–52.0)
HEMOGLOBIN: 13.9 g/dL (ref 13.0–17.0)
Potassium: 3.1 mmol/L — ABNORMAL LOW (ref 3.5–5.1)
SODIUM: 133 mmol/L — AB (ref 135–145)
TCO2: 32 mmol/L (ref 22–32)

## 2018-11-19 LAB — CBC WITH DIFFERENTIAL/PLATELET
Abs Immature Granulocytes: 0.02 10*3/uL (ref 0.00–0.07)
Basophils Absolute: 0 10*3/uL (ref 0.0–0.1)
Basophils Relative: 0 %
Eosinophils Absolute: 0 10*3/uL (ref 0.0–0.5)
Eosinophils Relative: 0 %
HCT: 39.8 % (ref 39.0–52.0)
Hemoglobin: 13.3 g/dL (ref 13.0–17.0)
Immature Granulocytes: 0 %
LYMPHS ABS: 0.8 10*3/uL (ref 0.7–4.0)
Lymphocytes Relative: 13 %
MCH: 28.6 pg (ref 26.0–34.0)
MCHC: 33.4 g/dL (ref 30.0–36.0)
MCV: 85.6 fL (ref 80.0–100.0)
Monocytes Absolute: 0.6 10*3/uL (ref 0.1–1.0)
Monocytes Relative: 11 %
Neutro Abs: 4.5 10*3/uL (ref 1.7–7.7)
Neutrophils Relative %: 76 %
PLATELETS: 174 10*3/uL (ref 150–400)
RBC: 4.65 MIL/uL (ref 4.22–5.81)
RDW: 10.8 % — AB (ref 11.5–15.5)
WBC: 6 10*3/uL (ref 4.0–10.5)
nRBC: 0 % (ref 0.0–0.2)

## 2018-11-19 LAB — ECHOCARDIOGRAM COMPLETE
Height: 65 in
Weight: 2250.46 oz

## 2018-11-19 LAB — MAGNESIUM: Magnesium: 2.2 mg/dL (ref 1.7–2.4)

## 2018-11-19 LAB — RAPID URINE DRUG SCREEN, HOSP PERFORMED
Amphetamines: NOT DETECTED
Barbiturates: NOT DETECTED
Benzodiazepines: NOT DETECTED
Cocaine: NOT DETECTED
Opiates: NOT DETECTED
Tetrahydrocannabinol: NOT DETECTED

## 2018-11-19 LAB — C-REACTIVE PROTEIN: CRP: <0.8 mg/dL (ref <1.0)

## 2018-11-19 LAB — SEDIMENTATION RATE: Sed Rate: 0 mm/hr (ref 0–16)

## 2018-11-19 LAB — I-STAT TROPONIN, ED: Troponin i, poc: 0 ng/mL (ref 0.00–0.08)

## 2018-11-19 LAB — TSH: TSH: 1.008 u[IU]/mL (ref 0.350–4.500)

## 2018-11-19 MED ORDER — ENOXAPARIN SODIUM 40 MG/0.4ML ~~LOC~~ SOLN
40.0000 mg | SUBCUTANEOUS | Status: DC
  Administered 2018-11-19 – 2018-11-20 (×2): 40 mg via SUBCUTANEOUS
  Filled 2018-11-19 (×2): qty 0.4

## 2018-11-19 MED ORDER — POTASSIUM CHLORIDE CRYS ER 20 MEQ PO TBCR
40.0000 meq | EXTENDED_RELEASE_TABLET | Freq: Once | ORAL | Status: AC
  Administered 2018-11-19: 40 meq via ORAL
  Filled 2018-11-19: qty 2

## 2018-11-19 MED ORDER — SALINE SPRAY 0.65 % NA SOLN
1.0000 | NASAL | Status: DC | PRN
  Filled 2018-11-19: qty 44

## 2018-11-19 MED ORDER — ASPIRIN 81 MG PO CHEW
324.0000 mg | CHEWABLE_TABLET | Freq: Once | ORAL | Status: AC
  Administered 2018-11-19: 324 mg via ORAL
  Filled 2018-11-19: qty 4

## 2018-11-19 MED ORDER — IBUPROFEN 400 MG PO TABS
600.0000 mg | ORAL_TABLET | Freq: Three times a day (TID) | ORAL | Status: DC
  Administered 2018-11-19 – 2018-11-21 (×5): 600 mg via ORAL
  Filled 2018-11-19 (×6): qty 1

## 2018-11-19 MED ORDER — POTASSIUM CHLORIDE CRYS ER 20 MEQ PO TBCR: 40.0000 meq | EXTENDED_RELEASE_TABLET | Freq: Two times a day (BID) | ORAL | Status: DC

## 2018-11-19 MED ORDER — POLYETHYLENE GLYCOL 3350 17 G PO PACK: 17.0000 g | PACK | Freq: Every day | ORAL | Status: DC | PRN

## 2018-11-19 MED ORDER — LORATADINE 10 MG PO TABS
10.0000 mg | ORAL_TABLET | Freq: Every day | ORAL | Status: DC
  Administered 2018-11-20 – 2018-11-21 (×2): 10 mg via ORAL
  Filled 2018-11-19 (×2): qty 1

## 2018-11-19 MED ORDER — SODIUM CHLORIDE 0.9 % IV BOLUS
500.0000 mL | Freq: Once | INTRAVENOUS | Status: AC
  Administered 2018-11-19: 500 mL via INTRAVENOUS

## 2018-11-19 MED ORDER — PANTOPRAZOLE SODIUM 40 MG PO TBEC
40.0000 mg | DELAYED_RELEASE_TABLET | Freq: Every day | ORAL | Status: DC
  Administered 2018-11-20 – 2018-11-21 (×2): 40 mg via ORAL
  Filled 2018-11-19 (×2): qty 1

## 2018-11-19 MED ORDER — KETOROLAC TROMETHAMINE 15 MG/ML IJ SOLN
15.0000 mg | Freq: Once | INTRAMUSCULAR | Status: AC
  Administered 2018-11-19: 15 mg via INTRAVENOUS
  Filled 2018-11-19: qty 1

## 2018-11-19 MED ORDER — COLCHICINE 0.6 MG PO TABS
0.6000 mg | ORAL_TABLET | Freq: Every day | ORAL | Status: DC
  Administered 2018-11-19 – 2018-11-21 (×3): 0.6 mg via ORAL
  Filled 2018-11-19 (×3): qty 1

## 2018-11-19 NOTE — ED Notes (Signed)
EKG reviewed by EDP new order to repeat.

## 2018-11-19 NOTE — ED Notes (Signed)
ED Provider at bedside. 

## 2018-11-19 NOTE — ED Provider Notes (Signed)
[facility] EMERGENCY DEPARTMENT Provider Note   CSN: [id number] Arrival date & time: 11/19/18  1132     History   Chief Complaint Chief Complaint  Patient presents with  . Tachycardia    HPI Gary Boyd is a 23 y.o. male.  23 year old male with prior medical history as detailed below presents for evaluation of multiple complaints.  Patient reports intermittent palpitations.  Patient reports "stinging" to the left arm and left chest.  Patient reports having "low energy."  The symptoms been ongoing since approximately Christmas.  Patient reports that his symptoms were worse today so decided to come get checked out.  Patient does report the "stinging" discomfort of the left chest is worse when he lies flat.  It is improved when he sits up or leans forward.  Patient without prior history of cardiac illness.  Patient denies use of drugs --  including cocaine or marijuana.  The history is provided by the patient and medical records.  Palpitations  Palpitations quality:  Irregular Onset quality:  Sudden Duration:  2 weeks Timing:  Intermittent Progression:  Waxing and waning Chronicity:  New Relieved by:  Nothing Worsened by:  Nothing Associated symptoms: chest pain   Associated symptoms: no shortness of breath     Past Medical History:  Diagnosis Date  . Allergy    seasonal  . GERD (gastroesophageal reflux disease)   . Seasonal allergies     There are no active problems to display for this patient.   History reviewed. No pertinent surgical history.      Home Medications    Prior to Admission medications   Medication Sig Start Date End Date Taking? Authorizing Provider  cetirizine (ZYRTEC) 10 MG tablet Take 10 mg by mouth daily.   Yes [provider]  Multiple Vitamin (MULTIVITAMIN WITH MINERALS) TABS tablet Take 1 tablet by mouth daily.   Yes [provider]  predniSONE (STERAPRED UNI-PAK 21 TAB) 10 MG (21) TBPK tablet  Take 10 mg by mouth as directed. Take as directed on dose package for 6 days 11/15/18 11/25/18 Yes [provider]  sodium chloride (OCEAN) 0.65 % SOLN nasal spray Place 1 spray into both nostrils as needed for congestion.   Yes [provider]  pantoprazole (PROTONIX) 40 MG tablet Take 1 tablet (40 mg total) by mouth daily. Patient not taking: Reported on 11/19/2018 02/11/18   Hilarie Fredrickson, MD    Family History Family History  Problem Relation Age of Onset  . Stroke Maternal Grandmother   . Stomach cancer Neg Hx   . Esophageal cancer Neg Hx     Social History Social History   Tobacco Use  . Smoking status: Never Smoker  . Smokeless tobacco: Never Used  Substance Use Topics  . Alcohol use: Not Currently    Frequency: Never  . Drug use: No     Allergies   Lactose intolerance (gi)   Review of Systems Review of Systems  Respiratory: Negative for shortness of breath.   Cardiovascular: Positive for chest pain and palpitations.  All other systems reviewed and are negative.    Physical Exam Updated Vital Signs BP 109/73   Pulse 79   Temp (!) 97.5 F (36.4 C) (Oral)   Resp 15   SpO2 98%   Physical Exam Vitals signs and nursing note reviewed.  Constitutional:      General: He is not in acute distress.    Appearance: He is well-developed.  HENT:  Head: Normocephalic and atraumatic.  Eyes:     Conjunctiva/sclera: Conjunctivae normal.     Pupils: Pupils are equal, round, and reactive to light.  Neck:     Musculoskeletal: Normal range of motion and neck supple.  Cardiovascular:     Rate and Rhythm: Normal rate and regular rhythm.     Heart sounds: Normal heart sounds.  Pulmonary:     Effort: Pulmonary effort is normal. No respiratory distress.     Breath sounds: Normal breath sounds.  Abdominal:     General: There is no distension.     Palpations: Abdomen is soft.     Tenderness: There is no abdominal tenderness.  Musculoskeletal: Normal range  of motion.        General: No deformity.  Skin:    General: Skin is warm and dry.  Neurological:     Mental Status: He is alert and oriented to person, place, and time.      ED Treatments / Results  Labs (all labs ordered are listed, but only abnormal results are displayed) Labs Reviewed  CBC WITH DIFFERENTIAL/PLATELET - Abnormal; Notable for the following components:      Result Value   RDW 10.8 (*)    All other components within normal limits  I-STAT CHEM 8, ED - Abnormal; Notable for the following components:   Sodium 133 (*)    Potassium 3.1 (*)    Chloride 91 (*)    Glucose, Bld 113 (*)    Calcium, Ion 1.12 (*)    All other components within normal limits  RAPID URINE DRUG SCREEN, HOSP PERFORMED  MAGNESIUM  TSH  I-STAT TROPONIN, ED    EKG EKG Interpretation  Date/Time:  Wednesday November 19 2018 12:02:04 EST Ventricular Rate:  84 PR Interval:  152 QRS Duration: 88 QT Interval:  348 QTC Calculation: 411 R Axis:   89 Text Interpretation:  Normal sinus rhythm with sinus arrhythmia ST elevation consider anterolateral injury or acute infarct ST elevation consider inferior injury or acute infarct Abnormal ECG Reconfirmed by Kristine Royal [id number]) on 11/19/2018 3:04:34 PM   Radiology Dg Chest Port 1 View  Result Date: 11/19/2018 CLINICAL DATA:  Chest pain and tachycardia.  Left arm numbness. EXAM: PORTABLE CHEST 1 VIEW COMPARISON:  01/05/2018 FINDINGS: The heart size and mediastinal contours are within normal limits. Both lungs are clear. The visualized skeletal structures are unremarkable. IMPRESSION: No active disease. Electronically Signed   By: Myles Rosenthal M.D.   On: 11/19/2018 12:26    Procedures Procedures (including critical care time)  Medications Ordered in ED Medications  sodium chloride 0.9 % bolus 500 mL (500 mLs Intravenous New Bag/Given 11/19/18 1345)  aspirin chewable tablet 324 mg (324 mg Oral Given 11/19/18 1216)  potassium chloride SA  (K-DUR,KLOR-CON) CR tablet 40 mEq (40 mEq Oral Given 11/19/18 1334)  ketorolac (TORADOL) 15 MG/ML injection 15 mg (15 mg Intravenous Given 11/19/18 1346)     Initial Impression / Assessment and Plan / ED Course  I have reviewed the triage vital signs and the nursing notes.  Pertinent labs & imaging results that were available during my care of the patient were reviewed by me and considered in my medical decision making (see chart for details).   1155 EKG is abnormal - diffuse ST elevations noted - Patient's history and exam and EKG are more consistent with possible pericarditis (Vs STEMI) - EKG checked by Cardiology (Dr. Rennis Golden) and they agree that patient not appropriate for emergent Cath Lab activation.  Per Rosann Auerbachrish, Cards will consult.     MDM  Screen complete  Patient is presenting for evaluation of a host of complaints including atypical chest discomfort.  Duration of symptoms has been ongoing over the last 2 weeks.  Initial screening EKG is notably abnormal.  There are diffuse ST elevations.  Patient's presentation is not consistent with acute ST elevation MI.  EKG reviewed by cardiology -they agree with the decision not to activate the Cath Lab.  Patient screening labs otherwise are without significant abnormality.  Initial troponin is negative.  Patient's potassium is mildly decreased at 3.1.  This was supplemented.  Patient's symptoms did improve after administration of Toradol.    Patient will be admitted to the medicine service for further work-up and treatment.  Cardiology is aware of the case.  Cardiology will consult.   Final Clinical Impressions(s) / ED Diagnoses   Final diagnoses:  Pericarditis, unspecified chronicity, unspecified type    ED Discharge Orders    None       Wynetta FinesMessick, America Sandall C, MD 11/19/18 1504
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
## 2018-11-19 NOTE — H&P (Addendum)
Family Medicine Teaching [facility] Admission History and Physical Service Pager: [phone]  Patient name: Gary Boyd Medical record number: [id number] Date of birth: 1996/02/25 Age: 23 y.o. Gender: male  Primary Care Provider: Sebastian Ache, PA-C Consultants: Cardiology  Code Status: full  Chief Complaint: chest pain radiating to left arm  Assessment and Plan: Gary Boyd is a 23 y.o. male presenting with acute onset chest pain radiating to left arm . PMH is significant for seasonal allergies and GERD.   Chest pain Unclear etiology at this time.  EKG on admission showing diffuse ST elevations in anterolateral and inferior leads.  Initial i-STAT troponin negative.  Heart score of 2, for EKG changes.  Unlikely MI as patient is very young and healthy and no cardiac history in family either. Per cardiology, does not believe it is related to STEMI as well. VSS. WBC wnl. Physical exam showing somewhat muffled heart sounds.  Consider cardiac tamponade versus pericarditis.  History very strong for pericarditis given EKG changes as well as improvement with sitting forward.  Per cardiology consult will obtain echocardiogram.  Pending echocardiogram will come up with further plan.  If negative can consider treatment with NSAIDs.  Will defer further treatment to cardiology. -admit to telemetry, attending Dr. Lum Babe -vitals per routine -cardiology consulted appreciate recommendations: echo, sed rate/CRP/HIV screen  -ECHO -will start colchicine and ibuprofen 600mg  tid per cardiology -AM EKG -AM BMP/CBC -CRP and sed rate  -HIV screen  -OOB with assistance  -continuous cardiac monitoring    Hypokalemia K of 3.1 on admission. Mg wnl at 2.2. Patient reports poor diet due to GERD.  -monitor on daily BMP -replete with 40KDUR x2   GERD Patient with past medical history of acid reflux.  States that he has very limited diet as most foods cause him to have chest pain.  I  anticipate that significant amount of chest pain is related to his cardiac diagnosis has been going on for months now.  Anticipate that it will be improved once we can treat his cardiac etiology.  Home medications include Protonix 40 mg daily. -Continue home meds  Seasonal Allerges Usually controlled by home cetirizine 10 mg daily. -Will substitute loratadine as this is on formulary  FEN/GI: NPO pending cardiology plan  Prophylaxis: lovenox   Disposition: admit to telemetry for observation, attending Dr. Lum Babe   History of Present Illness:  Gary Boyd is a 23 y.o. male presenting with chest pain and palpitations   Patient states he woke up and felt like his heart was beating very fast. Was trying to calm down and took "medicine" (pantoprazole). States he did not feel anxious, his heart was just feeling fast. States moving around bed made it worse. Began to feel very fatigued. Patient had to call 911 because his heart was too fast and felt too fatigued. No sick contacts. Roommate had strep throat but last fall and patient did not get sick.   Only new things today was that exterminator sprayed for bugs today.   PMHx significant for "anxiety issue" last spring related to his allergies but usually walking and relaxing or taking a shower helps. Since last spring he has not feeling well. Was told last spring he had an "inflamed chest" which then caused acid reflux. Has seasonal allergies that are resolved with claritin-D. Also lactose intolerant. Does not have a PCP in [place]. Patient is from [place] so his PCP is there. Is a student at [employer].   Does report recent chest pain whenever he  eats. His hand also goes numb and has fatigue. Lemon and cucumber and salad are the only things he can eat. Chest pain better with sitting forward. Also endorses arm pain down left arm.   In ED EKG should diffuse ST elevation.  Cardiology was consulted who felt like EKG did not show signs of STEMI.  Per cardiology  patient will need echocardiogram.  Will come up with plan after echo is obtained.  Review Of Systems: Per HPI with the following additions:   Review of Systems  Respiratory: Negative for shortness of breath.   Cardiovascular: Positive for chest pain and palpitations.  Gastrointestinal: Positive for abdominal pain and heartburn. Negative for nausea and vomiting.    Patient Active Problem List   Diagnosis Date Noted  . Pericarditis 11/19/2018    Past Medical History: Past Medical History:  Diagnosis Date  . Allergy    seasonal  . GERD (gastroesophageal reflux disease)   . Seasonal allergies     Past Surgical History: History reviewed. No pertinent surgical history.  Social History: Social History   Tobacco Use  . Smoking status: Never Smoker  . Smokeless tobacco: Never Used  Substance Use Topics  . Alcohol use: Not Currently    Frequency: Never  . Drug use: No   Additional social history: lives in dorm housing, no drug use, tried alcohol 2-3 years ago, no tobacco use.   Please also refer to relevant sections of EMR.  Family History: Family History  Problem Relation Age of Onset  . Stroke Maternal Grandmother   . Stomach cancer Neg Hx   . Esophageal cancer Neg Hx   MGM with stroke No heart disease in family  Mother with pre-diabetes   Allergies and Medications: Allergies  Allergen Reactions  . Lactose Intolerance (Gi) Other (See Comments)    Stomach cramps, heartburn   No current facility-administered medications on file prior to encounter.    Current Outpatient Medications on File Prior to Encounter  Medication Sig Dispense Refill  . cetirizine (ZYRTEC) 10 MG tablet Take 10 mg by mouth daily.    . Multiple Vitamin (MULTIVITAMIN WITH MINERALS) TABS tablet Take 1 tablet by mouth daily.    . predniSONE (STERAPRED UNI-PAK 21 TAB) 10 MG (21) TBPK tablet Take 10 mg by mouth as directed. Take as directed on dose package for 6 days    . sodium chloride (OCEAN)  0.65 % SOLN nasal spray Place 1 spray into both nostrils as needed for congestion.    . pantoprazole (PROTONIX) 40 MG tablet Take 1 tablet (40 mg total) by mouth daily. (Patient not taking: Reported on 11/19/2018) 30 tablet 11    Objective: BP 112/84   Pulse 80   Temp (!) 97.5 F (36.4 C) (Oral)   Resp 15   SpO2 98%  Exam: General: awake and alert, sitting up in bed, NAD  Eyes: PERRL, EOMI ENTM: moist mucous membranes  Neck: supple, full ROM  Cardiovascular: muffles heart sounds, RRR, no murmur  Respiratory: CTAB, no wheezes, rales, or rhonchi  Gastrointestinal: soft, non tender, non distended, bowel sounds normal  MSK: no edema, full ROM of extremities  Derm: intact, no rashes  Neuro: no focal deficits, sensation intact Psych: normal affect   Labs and Imaging: CBC BMET  Recent Labs  Lab 11/19/18 1209 11/19/18 1218  WBC 6.0  --   HGB 13.3 13.9  HCT 39.8 41.0  PLT 174  --    Recent Labs  Lab 11/19/18 1218  NA 133*  K 3.1*  CL 91*  BUN 12  CREATININE 0.90  GLUCOSE 113*      Ref. Range 11/19/2018 12:18  Sodium Latest Ref Range: 135 - 145 mmol/L 133 (L)  Potassium Latest Ref Range: 3.5 - 5.1 mmol/L 3.1 (L)  Chloride Latest Ref Range: 98 - 111 mmol/L 91 (L)  Glucose Latest Ref Range: 70 - 99 mg/dL 629113 (H)  BUN Latest Ref Range: 6 - 20 mg/dL 12  Creatinine Latest Ref Range: 0.61 - 1.24 mg/dL 5.280.90  Calcium Ionized Latest Ref Range: 1.15 - 1.40 mmol/L 1.12 (L)    Ref. Range 11/19/2018 12:18  Hemoglobin Latest Ref Range: 13.0 - 17.0 g/dL 41.313.9  HCT Latest Ref Range: 39.0 - 52.0 % 41.0    Ref. Range 11/19/2018 12:09  WBC Latest Ref Range: 4.0 - 10.5 K/uL 6.0  RBC Latest Ref Range: 4.22 - 5.81 MIL/uL 4.65  Hemoglobin Latest Ref Range: 13.0 - 17.0 g/dL 24.413.3  HCT Latest Ref Range: 39.0 - 52.0 % 39.8  MCV Latest Ref Range: 80.0 - 100.0 fL 85.6  MCH Latest Ref Range: 26.0 - 34.0 pg 28.6  MCHC Latest Ref Range: 30.0 - 36.0 g/dL 01.033.4  RDW Latest Ref Range: 11.5 - 15.5 %  10.8 (L)  Platelets Latest Ref Range: 150 - 400 K/uL 174  nRBC Latest Ref Range: 0.0 - 0.2 % 0.0    Ref. Range 11/19/2018 12:45  TSH Latest Ref Range: 0.350 - 4.500 uIU/mL 1.008    Ref. Range 11/19/2018 12:10  Amphetamines Latest Ref Range: NONE DETECTED  NONE DETECTED  Barbiturates Latest Ref Range: NONE DETECTED  NONE DETECTED  Benzodiazepines Latest Ref Range: NONE DETECTED  NONE DETECTED  Opiates Latest Ref Range: NONE DETECTED  NONE DETECTED  COCAINE Latest Ref Range: NONE DETECTED  NONE DETECTED  Tetrahydrocannabinol Latest Ref Range: NONE DETECTED  NONE DETECTED    Ref. Range 11/19/2018 12:09  Magnesium Latest Ref Range: 1.7 - 2.4 mg/dL 2.2    Ref. Range 11/19/2018 12:16  Troponin i, poc Latest Ref Range: 0.00 - 0.08 ng/mL 0.00    Oralia Manisbraham, Algie Cales, DO 11/19/2018, 3:58 PM PGY-2, [facility] FPTS Intern pager: [phone], text pages welcome

## 2018-11-19 NOTE — Progress Notes (Signed)
  Echocardiogram 2D Echocardiogram has been performed.  Celene Skeen 11/19/2018, 4:46 PM

## 2018-11-19 NOTE — ED Notes (Signed)
Admitting at bedside 

## 2018-11-19 NOTE — Consult Note (Addendum)
Consult Note     Patient ID: Bard HerbertDallas Geis MRN: [id number], DOB/AGE: Jul 23, 1996   Admit date: 11/19/2018  Primary Physician: Sebastian AcheMcVey, Elizabeth Whitney, PA-C Primary Cardiologist: New   Patient Profile    Gary Boyd is a 23yo M with a hx of seasonal allergies and GERD who [facility] with c/o of intermittent chest pain with radiation to his left arm with associated palpitations since the end of December.  Past Medical History   Past Medical History:  Diagnosis Date  . Allergy    seasonal  . GERD (gastroesophageal reflux disease)   . Seasonal allergies     History reviewed. No pertinent surgical history.   Allergies  Allergies  Allergen Reactions  . Lactose Intolerance (Gi) Other (See Comments)    Stomach cramps, heartburn   History of Present Illness    Gary Boyd is a 23 year old male with a history stated above who [facility] with complaints of intermittent chest pain with associated palpitations which began on Christmas day while visiting with his family. He states that he began having chest discomfort after eating and noticed left arm numbness and tingling at times. He says that the pain dissipated on its own however would return intermittently when he would eat or go out with temperature changes. He was seen at an urgent care on Saturday and has been treated wiuth what sounds like a prednisone taper and Zyrtec-D with some relief. He denies recent fever, chills, N/V or cough. He has a roommate who was diagnosed with strep throat prior to christmas break from school however he reports no symptoms of this. He denies orthopnea, PND, dizziness or syncope. He has had recent fatigue and mild SOB. Given that his symptoms have persisted with associated palpitations, he proceeded to the ED for further evaluation.   Upon ED arrival, EKG performed given his complaint of chest pain which showed ST elevations in leads II, II, avF and VIII. There  is marked, diffuse T wave inversions in avL, VI, and VII.  Cardiology was consulted by ED physician for acute EKG changes.  On call cardiology MD reviewed EKG on presentation and felt this is more consistent with possible pericarditis versus STEMI and therefore not appropriate for emergent Cath Lab activation.  Troponin level so far is negative x2 at 0.00.  Potassium found to be low at 3.1. Creatinine is normal at 0.90.  WBC normal at 6.0. CXR completed with no active cardiopulmonary disease noted. Toxicology screen was negative.   Home Medications    Prior to Admission medications   Medication Sig Start Date End Date Taking? Authorizing Provider  cetirizine (ZYRTEC) 10 MG tablet Take 10 mg by mouth daily.   Yes [provider]  Multiple Vitamin (MULTIVITAMIN WITH MINERALS) TABS tablet Take 1 tablet by mouth daily.   Yes [provider]  predniSONE (STERAPRED UNI-PAK 21 TAB) 10 MG (21) TBPK tablet Take 10 mg by mouth as directed. Take as directed on dose package for 6 days 11/15/18 11/25/18 Yes [provider]  sodium chloride (OCEAN) 0.65 % SOLN nasal spray Place 1 spray into both nostrils as needed for congestion.   Yes [provider]  pantoprazole (PROTONIX) 40 MG tablet Take 1 tablet (40 mg total) by mouth daily. Patient not taking: Reported on 11/19/2018 02/11/18   Hilarie FredricksonPerry, John N, MD    Family History    Family History  Problem Relation Age of Onset  . Stroke Maternal Grandmother   .  Stomach cancer Neg Hx   . Esophageal cancer Neg Hx     Social History    Social History   Socioeconomic History  . Marital status: Single    Spouse name: Not on file  . Number of children: 0  . Years of education: Not on file  . Highest education level: Not on file  Occupational History  . Not on file  Social Needs  . Financial resource strain: Not on file  . Food insecurity:    Worry: Not on file    Inability: Not on file  . Transportation needs:    Medical:  Not on file    Non-medical: Not on file  Tobacco Use  . Smoking status: Never Smoker  . Smokeless tobacco: Never Used  Substance and Sexual Activity  . Alcohol use: Not Currently    Frequency: Never  . Drug use: No  . Sexual activity: Never  Lifestyle  . Physical activity:    Days per week: Not on file    Minutes per session: Not on file  . Stress: Not on file  Relationships  . Social connections:    Talks on phone: Not on file    Gets together: Not on file    Attends religious service: Not on file    Active member of club or organization: Not on file    Attends meetings of clubs or organizations: Not on file    Relationship status: Not on file  . Intimate partner violence:    Fear of current or ex partner: Not on file    Emotionally abused: Not on file    Physically abused: Not on file    Forced sexual activity: Not on file  Other Topics Concern  . Not on file  Social History Narrative  . Not on file     Review of Systems   See HPI  All other systems reviewed and are otherwise negative except as noted above.  Physical Exam    Blood pressure 109/73, pulse 79, temperature (!) 97.5 F (36.4 C), temperature source Oral, resp. rate 15, SpO2 98 %.   General: Well developed, well nourished, NAD Skin: Warm, dry, intact  Head: Normocephalic, atraumatic, clear, moist mucus membranes. Neck: Negative for carotid bruits. No JVD Lungs:Clear to ausculation bilaterally. No wheezes, rales, or rhonchi. Breathing is unlabored. Cardiovascular: RRR with S1 S2. No murmurs, rubs, gallops, or LV heave appreciated. Abdomen: Soft, non-tender, non-distended with normoactive bowel sounds. No obvious abdominal masses. MSK: Strength and tone appear normal for age. 5/5 in all extremities Extremities: No edema. No clubbing or cyanosis. DP/PT pulses 2+ bilaterally Neuro: Alert and oriented. No focal deficits. No facial asymmetry. MAE spontaneously. Psych: Responds to questions appropriately  with normal affect.    Labs    Troponin [facility] of Care Test) Recent Labs    11/19/18 1216  TROPIPOC 0.00   No results for input(s): CKTOTAL, CKMB, TROPONINI in the last 72 hours. Lab Results  Component Value Date   WBC 6.0 11/19/2018   HGB 13.9 11/19/2018   HCT 41.0 11/19/2018   MCV 85.6 11/19/2018   PLT 174 11/19/2018    Recent Labs  Lab 11/19/18 1218  NA 133*  K 3.1*  CL 91*  BUN 12  CREATININE 0.90  GLUCOSE 113*   No results found for: CHOL, HDL, LDLCALC, TRIG No results found for: [facility]   Radiology Studies    Dg Chest Port 1 View  Result Date: 11/19/2018 CLINICAL DATA:  Chest pain  and tachycardia.  Left arm numbness. EXAM: PORTABLE CHEST 1 VIEW COMPARISON:  01/05/2018 FINDINGS: The heart size and mediastinal contours are within normal limits. Both lungs are clear. The visualized skeletal structures are unremarkable. IMPRESSION: No active disease. Electronically Signed   By: Myles RosenthalJohn  Stahl M.D.   On: 11/19/2018 12:26   ECG & Cardiac Imaging    11/19/2018: NSR with ST elevations in leads II, II, avF and VIII. There is marked, diffuse T wave inversions in avL, VI, and VII.   Assessment & Plan    1.  Chest pain with no prior hx of CAD: -Patient presented with intermittent chest pain with associated palpitations which began on Christmas day while visiting with his family. He states that he began having chest discomfort after eating and noticed left arm numbness and tingling at times. He says that the pain dissipated on its own however would return intermittently when he would eat or go out with temperature changes.  -Troponin, 0.00, 0.00 -EKG with ST elevations in leads II, II, avF and VIII. There is marked, diffuse T wave inversions in avL, VI, and VII. Cardiology was consulted by ED physician for acute EKG changes. On call cardiology MD reviewed EKG on presentation and felt this is more consistent with possible pericarditis versus STEMI and therefore not appropriate for  emergent Cath Lab activation.   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  He has no cardiac risk factors.  The pain began several weeks ago.  It occurs on daily basis.  It is pleuritic, positional and sometimes related to food.  There are no other associated symptoms.  He has no risk factors.  His EKG is consistent with acute pericarditis.  I do not hear a rub.  We will get routine labs including sed rate and CRP as well as a 2D echocardiogram.  Would start ibuprofen as as well as colchicine.  We will follow with you   Runell GessJonathan J. Jourdin Connors, M.D., FACP, [facility] HospitalFACC, Kathryne ErikssonFAHA, FSCAI [facility], [facility] [address] [place], [place]  [id number]  [phone] 11/19/2018 4:03 PM

## 2018-11-20 DIAGNOSIS — F329 Major depressive disorder, single episode, unspecified: Secondary | ICD-10-CM

## 2018-11-20 DIAGNOSIS — F32A Depression, unspecified: Secondary | ICD-10-CM

## 2018-11-20 DIAGNOSIS — F419 Anxiety disorder, unspecified: Secondary | ICD-10-CM

## 2018-11-20 LAB — BASIC METABOLIC PANEL
ANION GAP: 9 (ref 5–15)
BUN: 14 mg/dL (ref 6–20)
CALCIUM: 9.4 mg/dL (ref 8.9–10.3)
CO2: 29 mmol/L (ref 22–32)
Chloride: 100 mmol/L (ref 98–111)
Creatinine, Ser: 1.07 mg/dL (ref 0.61–1.24)
GFR calc Af Amer: >60 mL/min (ref >60)
GFR calc non Af Amer: >60 mL/min (ref >60)
Glucose, Bld: 91 mg/dL (ref 70–99)
Potassium: 3.7 mmol/L (ref 3.5–5.1)
Sodium: 138 mmol/L (ref 135–145)

## 2018-11-20 LAB — CBC
HCT: 39.7 % (ref 39.0–52.0)
Hemoglobin: 13.2 g/dL (ref 13.0–17.0)
MCH: 28.6 pg (ref 26.0–34.0)
MCHC: 33.2 g/dL (ref 30.0–36.0)
MCV: 86.1 fL (ref 80.0–100.0)
Platelets: 166 10*3/uL (ref 150–400)
RBC: 4.61 MIL/uL (ref 4.22–5.81)
RDW: 11 % — ABNORMAL LOW (ref 11.5–15.5)
WBC: 5.4 10*3/uL (ref 4.0–10.5)
nRBC: 0 % (ref 0.0–0.2)

## 2018-11-20 LAB — HIV ANTIBODY (ROUTINE TESTING W REFLEX): HIV Screen 4th Generation wRfx: NONREACTIVE

## 2018-11-20 MED ORDER — BOOST PLUS PO LIQD
237.0000 mL | Freq: Three times a day (TID) | ORAL | Status: DC
  Administered 2018-11-20 – 2018-11-21 (×2): 237 mL via ORAL
  Filled 2018-11-20 (×6): qty 237

## 2018-11-20 NOTE — Consult Note (Addendum)
[facility] Face-to-Face Psychiatry Consult   Reason for Consult:  Anxiety and possible disordered eating Referring Physician:  Dr. Lum Babe Patient Identification: Gary Boyd MRN:  [id number] Principal Diagnosis: Anxiety and depression Diagnosis:  Active Problems:   Pericarditis   Seasonal allergies   Gastroesophageal reflux disease without esophagitis   Total Time spent with patient: 30 minutes  Subjective:   Gary Boyd is a 23 y.o. male patient admitted with chest pain.  HPI:   Per chart review, patient was admitted with chest pain of unknown etiology. There is concern for pericarditis due to diffuse ST segment elevations in anterolateral and inferior leads. He has lost 20 pounds in 4 months. He has only been eating cucumber, lemon and water since December after he ate food and developed palpitations and chest pain. Food has not been appealing to him since this time. He has chest pain whenever he eats. He also reports that his hand becomes numb and he develops fatigue. UDS was negative on admission.   On interview, Gary Boyd reports eating a "corn chip "" last spring and it got stuck in his throat.  He reports that this led to inflammation and required him to take allergy medication for acid reflux.  He reports that he did not take it for the past week and noticed that his acid reflux worsened which led to chest pain.  He also reports left arm numbness since Christmas.  He reports that he is afraid to try to move his left arm because this exacerbates his chest pain.  He denies a prior psychiatric history.  He does admit to feeling depressed over the past 6 months with generalized worries about his health.  He reports that he has lost 20 pounds due to being unable to eat.  He has not been sleeping well for several months.  He reports that he had an ant infestation in his dorm.  He reports that this worsened his anxiety due to the terminator spraying and increasing the risk of breathing in  toxins.  He denies a history of manic symptoms (decreased need for sleep, increased energy, pressured speech or euphoria).  He denies a history of disorderly eating.  He does not endorse SI, HI or AVH.  Past Psychiatric History: Denies   Risk to Self:  None Risk to Others:  None Prior Inpatient Therapy:  Denies  Prior Outpatient Therapy:  Denies   Past Medical History:  Past Medical History:  Diagnosis Date  . Allergy    seasonal  . GERD (gastroesophageal reflux disease)   . Seasonal allergies    History reviewed. No pertinent surgical history. Family History:  Family History  Problem Relation Age of Onset  . Stroke Maternal Grandmother   . Stomach cancer Neg Hx   . Esophageal cancer Neg Hx    Family Psychiatric  History: Denies  Social History:  Social History   Substance and Sexual Activity  Alcohol Use Not Currently  . Frequency: Never     Social History   Substance and Sexual Activity  Drug Use No    Social History   Socioeconomic History  . Marital status: Single    Spouse name: Not on file  . Number of children: 0  . Years of education: Not on file  . Highest education level: Not on file  Occupational History  . Not on file  Social Needs  . Financial resource strain: Not on file  . Food insecurity:    Worry: Not on file  Inability: Not on file  . Transportation needs:    Medical: Not on file    Non-medical: Not on file  Tobacco Use  . Smoking status: Never Smoker  . Smokeless tobacco: Never Used  Substance and Sexual Activity  . Alcohol use: Not Currently    Frequency: Never  . Drug use: No  . Sexual activity: Never  Lifestyle  . Physical activity:    Days per week: Not on file    Minutes per session: Not on file  . Stress: Not on file  Relationships  . Social connections:    Talks on phone: Not on file    Gets together: Not on file    Attends religious service: Not on file    Active member of club or organization: Not on file     Attends meetings of clubs or organizations: Not on file    Relationship status: Not on file  Other Topics Concern  . Not on file  Social History Narrative  . Not on file   Additional Social History: He is from [place]. He lives on campus at [employer]. 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 He is unemployed. He denies alcohol or illicit substance use.     Allergies:   Allergies  Allergen Reactions  . Lactose Intolerance (Gi) Other (See Comments)    Stomach cramps, heartburn    Labs:  Results for orders placed or performed during the hospital encounter of 11/19/18 (from the past 48 hour(s))  CBC with Differential     Status: Abnormal   Collection Time: 11/19/18 12:09 PM  Result Value Ref Range   WBC 6.0 4.0 - 10.5 K/uL   RBC 4.65 4.22 - 5.81 MIL/uL   Hemoglobin 13.3 13.0 - 17.0 g/dL   HCT 09.8 11.9 - 14.7 %   MCV 85.6 80.0 - 100.0 fL   MCH 28.6 26.0 - 34.0 pg   MCHC 33.4 30.0 - 36.0 g/dL   RDW 82.9 (L) 56.2 - 13.0 %   Platelets 174 150 - 400 K/uL   nRBC 0.0 0.0 - 0.2 %   Neutrophils Relative % 76 %   Neutro Abs 4.5 1.7 - 7.7 K/uL   Lymphocytes Relative 13 %   Lymphs Abs 0.8 0.7 - 4.0 K/uL   Monocytes Relative 11 %   Monocytes Absolute 0.6 0.1 - 1.0 K/uL   Eosinophils Relative 0 %   Eosinophils Absolute 0.0 0.0 - 0.5 K/uL   Basophils Relative 0 %   Basophils Absolute 0.0 0.0 - 0.1 K/uL   Immature Granulocytes 0 %   Abs Immature Granulocytes 0.02 0.00 - 0.07 K/uL    Comment: Performed at [facility], [address], [place], [place] [address]  Magnesium     Status: None   Collection Time: 11/19/18 12:09 PM  Result Value Ref Range   Magnesium 2.2 1.7 - 2.4 mg/dL    Comment: Performed at [facility], [address] [place], [place] [address]  Urine rapid drug screen (hosp performed)     Status: None   Collection Time: 11/19/18 12:10 PM  Result Value Ref Range   Opiates NONE DETECTED NONE DETECTED   Cocaine NONE DETECTED NONE DETECTED    Benzodiazepines NONE DETECTED NONE DETECTED   Amphetamines NONE DETECTED NONE DETECTED   Tetrahydrocannabinol NONE DETECTED NONE DETECTED   Barbiturates NONE DETECTED NONE DETECTED    Comment: (NOTE) DRUG SCREEN FOR MEDICAL PURPOSES ONLY.  IF CONFIRMATION IS NEEDED  FOR ANY PURPOSE, NOTIFY LAB WITHIN 5 DAYS. LOWEST DETECTABLE LIMITS FOR URINE DRUG SCREEN Drug Class                     Cutoff (ng/mL) Amphetamine and metabolites    1000 Barbiturate and metabolites    200 Benzodiazepine                 200 Tricyclics and metabolites     300 Opiates and metabolites        300 Cocaine and metabolites        300 THC                            50 Performed at [facility], [address] [place], [place] [address]   I-stat troponin, ED     Status: None   Collection Time: 11/19/18 12:16 PM  Result Value Ref Range   Troponin i, poc 0.00 0.00 - 0.08 ng/mL   Comment 3            Comment: Due to the release kinetics of cTnI, a negative result within the first hours of the onset of symptoms does not rule out myocardial infarction with certainty. If myocardial infarction is still suspected, repeat the test at appropriate intervals.   I-stat Chem 8, ED     Status: Abnormal   Collection Time: 11/19/18 12:18 PM  Result Value Ref Range   Sodium 133 (L) 135 - 145 mmol/L   Potassium 3.1 (L) 3.5 - 5.1 mmol/L   Chloride 91 (L) 98 - 111 mmol/L   BUN 12 6 - 20 mg/dL   Creatinine, Ser 6.040.90 0.61 - 1.24 mg/dL   Glucose, Bld 540113 (H) 70 - 99 mg/dL   Calcium, Ion 9.811.12 (L) 1.15 - 1.40 mmol/L   TCO2 32 22 - 32 mmol/L   Hemoglobin 13.9 13.0 - 17.0 g/dL   HCT 19.141.0 47.839.0 - 29.552.0 %  TSH     Status: None   Collection Time: 11/19/18 12:45 PM  Result Value Ref Range   TSH 1.008 0.350 - 4.500 uIU/mL    Comment: Performed by a 3rd Generation assay with a functional sensitivity of <=0.01 uIU/mL. Performed at [facility], [address] [place], [place] [address]   Sedimentation rate      Status: None   Collection Time: 11/19/18  5:13 PM  Result Value Ref Range   Sed Rate 0 0 - 16 mm/hr    Comment: Performed at [facility], [address] [place], [place] [address]  C-reactive protein     Status: None   Collection Time: 11/19/18  5:13 PM  Result Value Ref Range   CRP <0.8 <1.0 mg/dL    Comment: Performed at [facility], [address] [place], [place] [address]  HIV Antibody (routine testing w rflx)     Status: None   Collection Time: 11/19/18  5:13 PM  Result Value Ref Range   HIV Screen 4th Generation wRfx Non Reactive Non Reactive    Comment: (NOTE) Performed At: [facility] [address] [place], [place] [id number] Jolene SchimkeNagendra Sanjai MD MW:[id number]:[phone]   Basic metabolic panel     Status: None   Collection Time: 11/20/18  4:30 AM  Result Value Ref Range   Sodium 138 135 - 145 mmol/L   Potassium 3.7 3.5 - 5.1 mmol/L   Chloride 100 98 - 111  mmol/L   CO2 29 22 - 32 mmol/L   Glucose, Bld 91 70 - 99 mg/dL   BUN 14 6 - 20 mg/dL   Creatinine, Ser 0.451.07 0.61 - 1.24 mg/dL   Calcium 9.4 8.9 - 40.910.3 mg/dL   GFR calc non Af Amer >60 >60 mL/min   GFR calc Af Amer >60 >60 mL/min   Anion gap 9 5 - 15    Comment: Performed at [facility], [address] [place], [place] [address]  CBC     Status: Abnormal   Collection Time: 11/20/18  4:30 AM  Result Value Ref Range   WBC 5.4 4.0 - 10.5 K/uL   RBC 4.61 4.22 - 5.81 MIL/uL   Hemoglobin 13.2 13.0 - 17.0 g/dL   HCT 47.839.7 29.539.0 - 62.152.0 %   MCV 86.1 80.0 - 100.0 fL   MCH 28.6 26.0 - 34.0 pg   MCHC 33.2 30.0 - 36.0 g/dL   RDW 30.811.0 (L) 65.711.5 - 84.615.5 %   Platelets 166 150 - 400 K/uL   nRBC 0.0 0.0 - 0.2 %    Comment: Performed at [facility], [address] [place], [place] [address]    Current Facility-Administered Medications  Medication Dose Route Frequency Provider Last Rate Last Dose  . colchicine tablet 0.6 mg  0.6 mg Oral Daily Georgie ChardMcDaniel, Jill D, NP   0.6 mg at 11/20/18 0859  .  enoxaparin (LOVENOX) injection 40 mg  40 mg Subcutaneous Q24H Darin EngelsAbraham, Sherin, DO   40 mg at 11/19/18 1721  . ibuprofen (ADVIL,MOTRIN) tablet 600 mg  600 mg Oral TID Georgie ChardMcDaniel, Jill D, NP   600 mg at 11/20/18 0859  . loratadine (CLARITIN) tablet 10 mg  10 mg Oral Daily Darin EngelsAbraham, Sherin, DO   10 mg at 11/20/18 0859  . pantoprazole (PROTONIX) EC tablet 40 mg  40 mg Oral Daily Darin EngelsAbraham, Sherin, DO   40 mg at 11/20/18 0859  . polyethylene glycol (MIRALAX / GLYCOLAX) packet 17 g  17 g Oral Daily PRN Oralia ManisAbraham, Sherin, DO      . sodium chloride (OCEAN) 0.65 % nasal spray 1 spray  1 spray Each Nare PRN Oralia ManisAbraham, Sherin, DO        Musculoskeletal: Strength & Muscle Tone: within normal limits Gait & Station: UTA since lying in bed. Patient leans: N/A  Psychiatric Specialty Exam: Physical Exam  Nursing note and vitals reviewed. Constitutional: He is oriented to person, place, and time. He appears well-developed.  thin  HENT:  Head: Normocephalic and atraumatic.  Neck: Normal range of motion.  Respiratory: Effort normal.  Musculoskeletal: Normal range of motion.  Neurological: He is alert and oriented to person, place, and time.  Psychiatric: His speech is normal and behavior is normal. Judgment and thought content normal. His mood appears anxious. Cognition and memory are normal.    Review of Systems  Respiratory:       Dyspnea  Cardiovascular: Positive for chest pain.  Gastrointestinal: Negative for constipation, diarrhea, nausea and vomiting.  Psychiatric/Behavioral: Positive for depression. Negative for substance abuse. The patient is nervous/anxious.   All other systems reviewed and are negative.   Blood pressure 112/75, pulse 79, temperature 97.9 F (36.6 C), temperature source Oral, resp. rate 20, height 5\' 5"  (1.651 m), weight 63.8 kg, SpO2 98 %.Body mass index is 23.41 kg/m.  General Appearance: Fairly Groomed, thin, young, African American male, wearing a hospital hospital with hair  locs who is lying in bed. NAD.  Eye Contact:  Good  Speech:  Clear and Coherent and Slow  Volume:  Normal  Mood:  Anxious and Depressed  Affect:  Congruent  Thought Process:  Goal Directed, Linear and Descriptions of Associations: Intact  Orientation:  Full (Time, Place, and Person)  Thought Content:  Logical  Suicidal Thoughts:  No  Homicidal Thoughts:  No  Memory:  Immediate;   Good Recent;   Good Remote;   Good  Judgement:  Fair  Insight:  Fair  Psychomotor Activity:  Normal  Concentration:  Concentration: Good and Attention Span: Good  Recall:  Good  Fund of Knowledge:  Good  Language:  Good  Akathisia:  No  Handed:  Right  AIMS (if indicated):   N/A  Assets:  Communication Skills Desire for Improvement Housing Physical Health Resilience  ADL's:  Intact  Cognition:  WNL  Sleep:   Poor   Assessment: Gary Boyd is a 23 y.o. male who was admitted with chest pain of unknown etiology. There is concern for pericarditis due to diffuse ST segment elevations in anterolateral and inferior leads. He endorses anxiety and depression in the setting of medical problems. He also reports poor sleep and appetite with a 20 pound weight loss over the past 4 months. Recommend Remeron for symptoms and follow up with an outpatient psychiatrist and therapist. [facility] [employer] also offers mental health services.   Treatment Plan Summary: -Recommend Remeron 7.5 mg qhs for mood, anxiety, poor appetite and insomnia. Can increase to 15 mg qhs after a couple days if patient tolerates medication. -Please have SW provide patient with outpatient mental health resources. [place] [employer] also offers mental health services.  -EKG reviewed and QTc 393 on 1/16. Please closely monitor when starting or increasing QTc prolonging agents.  -Psychiatry will sign off on patient at this time. Please consult psychiatry again as needed.    Disposition: No evidence of imminent risk to self or others at present.   Patient does  not meet criteria for psychiatric inpatient admission.  Cherly Beach, DO 11/20/2018 12:57 PM

## 2018-11-20 NOTE — Progress Notes (Signed)
Patient called with c/o 5/10 left arm pain with tingling in left hands. Also c/o chest discomfort. Patient states he believes this was brought on after attempting to eat solid foods. Resident paged and notified. Requests vitals signs at this time.

## 2018-11-20 NOTE — Progress Notes (Signed)
Paged oncall pager at [phone] to inform of patient's elevated ST waves. Returned call from covering Md saying they expect it with pericarditis. Patient remains asymptomatic. Will continue to monitor for patient safety.

## 2018-11-20 NOTE — Progress Notes (Addendum)
Family Medicine Teaching Service Daily Progress Note Intern Pager: [phone]  Patient name: Gary Boyd Medical record number: [id number] Date of birth: 12/12/1995 Age: 23 y.o. Gender: male  Primary Care Provider: Dorise Hiss, PA-C Consultants: Cardiology, Psych Code Status: Full  Pt Overview and Major Events to Date:  1/15 Admitted to [facility] 1/16 Echo   Assessment and Plan: Gary Boyd is a 23 y.o. male presenting with acute onset chest pain radiating to left arm . PMH is significant for seasonal allergies and GERD.   Atypical chest pain likely Pericarditis: Stable Cardiology consulted and picture most consistent with pericarditis. Patient started on colchicine and Ibuprofen 633m TID yesterday. Expect post-viral etiology as most common cause, however, cannot exclude autoimmune. Repeat EKG with diffuse ST changes, unchanged from day prior.  Echo with EF 50 to 65% normal function and no effusion. CRP and ESR neg. CBC, TSH and CMP WNL. UDS and Troponins all negative. Has remained hemodynamically stable overnight. On exam, patient endorses chest pain similar to yesterday. Tolerated some PO but is still nervous to eat. Some anxiety appears to play a part in his symptoms as well.  - Cardiology following, appreciate recs, will follow up - Continue Colchicine and Ibuprofen 6073mTID per cards recs - Ambulate and OOB q4 hours - Psych consulted, will follow up - Consider autoimmune work up (ANA) if persists or recurs   Hypokalemia: resolved K 3.1>3.7 this AM s/p 40 kdur x 2.  - continue to monitor  Weight loss: Attributed weight loss to difficulty eating secondary to pain. Has lost ~30 pounds over the last year. Diet primarily consists of water and lemons as this did not cause him pain. He endorsed pain and coolness to his left arm with foods and to avoid making things worse, he would avoid food. - Will further investigate concern for underlying eating disorder - Dietician  consulted, will follow up  GERD:  - Continue home protonix 4044mD  Seasonal Allergies: Home meds: Cetirizine 42m74m.  - Continue Loratidine    FEN/GI: Regular diet, Proxonix  PPx: Lovenox  Disposition: Home once eating and ambulating and pending cards recs  Subjective:  Patient notes doing well overnight. Has some chest pain still, unchanged from yesterday. Denies any nausea or vomiting. But admits to feeling some "throat pain" that is chronic for him that makes him scared to eat. He also gets a "numbness and coolness" to his right arm when he eats.   Objective: Temp:  [97.9 F (36.6 C)-99.5 F (37.5 C)] 97.9 F (36.6 C) (01/16 1120) Pulse Rate:  [73-83] 79 (01/16 1120) Resp:  [15-20] 20 (01/16 1120) BP: (99-115)/(65-84) 112/75 (01/16 1120) SpO2:  [97 %-99 %] 98 % (01/16 1120) Weight:  [63.8 kg] 63.8 kg (01/15 1600) Physical Exam: General: well nourished, well developed, in no acute distress with non-toxic appearance, sitting up comfortably in bed  HEENT: normocephalic, atraumatic, moist mucous membranes Neck: supple, normal ROM CV: regular rate and rhythm without murmurs, rubs, or gallops, no lower extremity edema Lungs: clear to auscultation bilaterally with normal work of breathing Abdomen: soft, non-tender, non-distended, normoactive bowel sounds Skin: warm, dry, no rashes or lesions Extremities: warm and well perfused, normal tone MSK: chest wall nontender to palpation Neuro: Alert and oriented, speech normal  Laboratory: Recent Labs  Lab 11/19/18 1209 11/19/18 1218 11/20/18 0430  WBC 6.0  --  5.4  HGB 13.3 13.9 13.2  HCT 39.8 41.0 39.7  PLT 174  --  166   Recent Labs  Lab 11/19/18 1218 11/20/18 0430  NA 133* 138  K 3.1* 3.7  CL 91* 100  CO2  --  29  BUN 12 14  CREATININE 0.90 1.07  CALCIUM  --  9.4  GLUCOSE 113* 91   CRP: <0.8 ESR: 0 HIV: NR EKG: EF 50-55%, no effusion, normal function TSH: 1.008 EKG: diffuse ST  elevations  Imaging/Diagnostic Tests: Dg Chest Port 1 View  Result Date: 11/19/2018 CLINICAL DATA:  Chest pain and tachycardia.  Left arm numbness. EXAM: PORTABLE CHEST 1 VIEW COMPARISON:  01/05/2018 FINDINGS: The heart size and mediastinal contours are within normal limits. Both lungs are clear. The visualized skeletal structures are unremarkable. IMPRESSION: No active disease. Electronically Signed   By: Earle Gell M.D.   On: 11/19/2018 12:26    Danna Hefty, DO 11/20/2018, 1:15 PM PGY-1, [place] Intern pager: [phone], text pages welcome

## 2018-11-20 NOTE — Progress Notes (Signed)
Floor does not stock ensure at this time. Called pharmacy and pharmacist stated that Boost Plus, but only chocolate flavor available at this time. Request an order from MD.

## 2018-11-20 NOTE — Progress Notes (Signed)
Called Orpah Cobb, resident. Updated her on vital signs as documented. Notified that patient feels like this was brought on by eating solid foods. Request to offer ensure for caloric intake. Will continue to monitor. No additional orders at this time.

## 2018-11-20 NOTE — Discharge Summary (Signed)
[facility] Discharge Summary  Patient name: Gary Boyd Medical record number: [id number] Date of birth: [birth year] Age: 23 y.o. Gender: male Date of Admission: 11/19/2018  Date of Discharge: 11/21/2017 Admitting Physician: Kinnie Feil, MD  Primary Care Provider: Magda Kiel Gelene Mink, PA-C Consultants: Cardiology, Psychiatry   Indication for Hospitalization: Atypical chest pain   Discharge Diagnoses/Problem List:  Patient Active Problem List   Diagnosis Date Noted  . Anxiety and depression   . Pericarditis 11/19/2018  . Seasonal allergies   . Gastroesophageal reflux disease without esophagitis    Disposition: Home  Discharge Condition: Stable  Discharge Exam:  General: well nourished, well developed, in no acute distress with non-toxic appearance, sitting up comfortably in bed  HEENT: normocephalic, atraumatic, moist mucous membranes Neck: supple, normal ROM CV: regular rate and rhythm without murmurs, rubs, or gallops, no lower extremity edema Lungs: clear to auscultation bilaterally with normal work of breathing Abdomen: soft, non-tender, non-distended, normoactive bowel sounds Skin: warm, dry, no rashes or lesions Extremities: warm and well perfused, normal tone MSK: chest wall nontender to palpation, gait normal Neuro: Alert and oriented, speech normal  Brief Hospital Course:  Gary Boyd is a 23 y.o. male with past medical history significant for seasonal allergies and GERD, who presented with acute onset intermittent chest pain radiating to his left arm with associated palpitation x 2 weeks. It was noted to be pleuritic, positional, and sometimes related to food. Initial work included EKG with diffuse ST elevations, with negative troponins, UDS, ESR, and CRP. CBC and CMP unremarkable and CXR with no acute cardiopulmonary changes. Cardiology was consulted and felt presentation was most consistent with pericarditis. Echo was  obtained which was negative for effusion. Patient was started on Colchicine and Ibuprofen and instructed to continue Colchicine for 3 months and Ibuprofen for 2 weeks. Patient was advised to refrain from physical activity until symptoms resolve. Return precautions were discussed and he was instructed to follow up with cardiology as outpatient.   Patient was also noted to have 20 pound weight loss over 4 months, attributed, per patient, to be due to chest pain associated with eating solid foods. He had maintained a diet of water and lemon since onset of symptoms in December. He denied any symptoms that would be red flags for an eating disorder upon further investigation. Nutrition and psychiatry was consulted. He endorsed some depression and anxiety upon further review, however denied any SI or HI. He was recommended to start Remeron 7.'5mg'$  qHS for mood, anxiety, poor appetite and insomnia and he was started on this at time of discharge. He was asked to follow up closely with his PCP and [employer] student health for continued evaluation. He was also recommended to try supplementing diet with Boost/Ensure for extra calories. At time of discharge, he demonstrated ability to take in PO and ambulate without difficulty. Return precautions were dicussed and close follow up arranged.   Issues for Follow Up:  1. Ensure resolution of chest pain  2. Consider workup of autoimmune cause of Pericarditis if symptoms persist or recur  3. Further evaluation of 20-30 pound weight loss, recommend supplementation of Boost/Ensure for extra calories given solid foods cause symptoms 4. Continue evaluation of anxiety/depression and toleration of Remeron   Significant Procedures:  Echo   Significant Labs and Imaging:  Recent Labs  Lab 11/19/18 1209 11/19/18 1218 11/20/18 0430  WBC 6.0  --  5.4  HGB 13.3 13.9 13.2  HCT 39.8 41.0 39.7  PLT  174  --  166   Recent Labs  Lab 11/19/18 1209  11/19/18 1218 11/20/18 0430  11/21/18 0941  NA  --   --  133* 138 138  K  --    < > 3.1* 3.7 3.5  CL  --   --  91* 100 97*  CO2  --   --   --  29 24  GLUCOSE  --   --  113* 91 74  BUN  --   --  '12 14 18  '$ CREATININE  --   --  0.90 1.07 1.14  CALCIUM  --   --   --  9.4 9.5  MG 2.2  --   --   --   --    < > = values in this interval not displayed.   UDS: neg Troponin: 0 TSH: 1.008 HIV: NR CRP: <0.8 ESR: 0  EKG: ST elevations in leads II, II, avF and VIII; diffuse T wave inversions in avL, VI, and VII  Echo:  EF: 50-55% Study Conclusions  - Left ventricle: The cavity size was normal. Wall thickness was   normal. Systolic function was normal. The estimated ejection   fraction was in the range of 50% to 55%. Wall motion was normal;   there were no regional wall motion abnormalities.  Dg Chest Port 1 View  Result Date: 11/19/2018 CLINICAL DATA:  Chest pain and tachycardia.  Left arm numbness. EXAM: PORTABLE CHEST 1 VIEW COMPARISON:  01/05/2018 FINDINGS: The heart size and mediastinal contours are within normal limits. Both lungs are clear. The visualized skeletal structures are unremarkable. IMPRESSION: No active disease. Electronically Signed   By: Earle Gell M.D.   On: 11/19/2018 12:26    Results/Tests Pending at Time of Discharge: None  Discharge Medications:  Allergies as of 11/21/2018      Reactions   Lactose Intolerance (gi) Other (See Comments)   Stomach cramps, heartburn      Medication List    STOP taking these medications   predniSONE 10 MG (21) Tbpk tablet Commonly known as:  STERAPRED UNI-PAK 21 TAB   sodium chloride 0.65 % Soln nasal spray Commonly known as:  OCEAN     TAKE these medications   cetirizine 10 MG tablet Commonly known as:  ZYRTEC Take 10 mg by mouth daily.   colchicine 0.6 MG tablet Take 1 tablet (0.6 mg total) by mouth daily.   ibuprofen 600 MG tablet Commonly known as:  ADVIL,MOTRIN Take 1 tablet (600 mg total) by mouth 3 (three) times daily for 14 days.    mirtazapine 7.5 MG tablet Commonly known as:  REMERON Take 1 tablet (7.5 mg total) by mouth at bedtime.   multivitamin with minerals Tabs tablet Take 1 tablet by mouth daily.   pantoprazole 40 MG tablet Commonly known as:  PROTONIX Take 1 tablet (40 mg total) by mouth daily.       Discharge Instructions: Please refer to Patient Instructions section of EMR for full details.  Patient was counseled important signs and symptoms that should prompt return to medical care, changes in medications, dietary instructions, activity restrictions, and follow up appointments.   Follow-Up Appointments: Follow-up Information    [facility]. Schedule an appointment as soon as possible for a visit.   Why:  Cardiology office should call and schedule follow-up within 1-2 weeks. Please call if you do not hear from them. Contact information: [address] [place] [address]  [phone]       Horald Pollen, MD. Go on 11/25/2018.   Specialty:  Internal Medicine Why:  at 11:00 am Contact information: [place] [address] [address], [place], DO 11/21/2018, 11:59 AM PGY-1, [place]

## 2018-11-21 LAB — BASIC METABOLIC PANEL
Anion gap: 17 — ABNORMAL HIGH (ref 5–15)
BUN: 18 mg/dL (ref 6–20)
CALCIUM: 9.5 mg/dL (ref 8.9–10.3)
CO2: 24 mmol/L (ref 22–32)
Chloride: 97 mmol/L — ABNORMAL LOW (ref 98–111)
Creatinine, Ser: 1.14 mg/dL (ref 0.61–1.24)
GFR calc Af Amer: >60 mL/min (ref >60)
GFR calc non Af Amer: >60 mL/min (ref >60)
Glucose, Bld: 74 mg/dL (ref 70–99)
Potassium: 3.5 mmol/L (ref 3.5–5.1)
Sodium: 138 mmol/L (ref 135–145)

## 2018-11-21 MED ORDER — IBUPROFEN 600 MG PO TABS: 600.0000 mg | ORAL_TABLET | Freq: Three times a day (TID) | ORAL | 0 refills | Status: DC

## 2018-11-21 MED ORDER — MIRTAZAPINE 7.5 MG PO TABS: 7.5000 mg | ORAL_TABLET | Freq: Every day | ORAL | 0 refills | Status: DC

## 2018-11-21 MED ORDER — COLCHICINE 0.6 MG PO TABS: 0.6000 mg | ORAL_TABLET | Freq: Every day | ORAL | 0 refills | Status: DC

## 2018-11-21 MED ORDER — PROPOFOL 1000 MG/100ML IV EMUL
INTRAVENOUS | Status: AC
  Filled 2018-11-21: qty 100

## 2018-11-21 NOTE — Discharge Instructions (Signed)
You were admitted for having chest pain.  With the help of cardiology, we determined that your chest pain is related to inflammation around the outside of the heart called pericarditis.  The most common reason for this is following a viral infection, however there are many other sources for cause.  That being said, we will have you follow-up with cardiology at their clinic.  Their office should call you to schedule the appointment.  I have attached their information in case you need to reach them.  For your pericarditis, be sure to take Motrin 600 mg three times daily for 2 weeks and colchicine 0.6 mg daily for 3 months.  We have provided you prescriptions for both of these medications.  We understand that eating solid foods can precipitate your pain.  It is important that you maintain an adequate dietary intake.  If you are having difficulty with eating, supplement with Ensure or boost between meals.  The psychiatrist felt that there may be a component of anxiety which is contributing to your loss of weight.  You can start the Remeron at 7.5 mg before bedtime and follow-up with your primary care doctor. Social work will reach out to you following discharge to schedule an outpatient follow-up.    Please call your primary care doctor to schedule a hospital follow-up.

## 2018-11-21 NOTE — Progress Notes (Signed)
Made two attempts to ambulate the patient but he refused each attempt due to complaints of arm pain. I explained the importance of him getting up and ambulating. He continued to refuse despite explanation.

## 2018-11-24 ENCOUNTER — Telehealth: Payer: Self-pay

## 2018-11-24 NOTE — Telephone Encounter (Signed)
Please send message to the resident or the attending who completed his discharge (Hensel). Resident will need to call him to confirm his pharmacy. They will need to call his school pharmacy to cancel the prescription.

## 2018-11-24 NOTE — Telephone Encounter (Signed)
Pt called our office this morning stating he was cared for by our doctors during his inpatient stay. Pt states his discharge medications were sent to his school pharmacy, which is closed today. Pt is wondering if the medications can be sent to the [facility] on [address].

## 2018-11-25 ENCOUNTER — Emergency Department (HOSPITAL_COMMUNITY)
Admission: EM | Admit: 2018-11-25 | Discharge: 2018-11-25 | Disposition: A | Discharge status: Home / Self Care | Payer: Self-pay | Attending: Emergency Medicine | Admitting: Emergency Medicine

## 2018-11-25 ENCOUNTER — Encounter (HOSPITAL_COMMUNITY): Payer: Self-pay

## 2018-11-25 ENCOUNTER — Other Ambulatory Visit: Payer: Self-pay

## 2018-11-25 ENCOUNTER — Inpatient Hospital Stay: Payer: BLUE CROSS/BLUE SHIELD | Admitting: Emergency Medicine

## 2018-11-25 DIAGNOSIS — R202 Paresthesia of skin: Secondary | ICD-10-CM | POA: Insufficient documentation

## 2018-11-25 DIAGNOSIS — J302 Other seasonal allergic rhinitis: Secondary | ICD-10-CM | POA: Insufficient documentation

## 2018-11-25 DIAGNOSIS — Z79899 Other long term (current) drug therapy: Secondary | ICD-10-CM | POA: Insufficient documentation

## 2018-11-25 LAB — CBC WITH DIFFERENTIAL/PLATELET
Abs Immature Granulocytes: 0.01 K/uL (ref 0.00–0.07)
Basophils Absolute: 0 K/uL (ref 0.0–0.1)
Basophils Relative: 1 %
Eosinophils Absolute: 0.1 K/uL (ref 0.0–0.5)
Eosinophils Relative: 2 %
HCT: 41.2 % (ref 39.0–52.0)
Hemoglobin: 13.4 g/dL (ref 13.0–17.0)
Immature Granulocytes: 0 %
Lymphocytes Relative: 40 %
Lymphs Abs: 1.4 K/uL (ref 0.7–4.0)
MCH: 29 pg (ref 26.0–34.0)
MCHC: 32.5 g/dL (ref 30.0–36.0)
MCV: 89.2 fL (ref 80.0–100.0)
Monocytes Absolute: 0.4 K/uL (ref 0.1–1.0)
Monocytes Relative: 11 %
Neutro Abs: 1.6 K/uL — ABNORMAL LOW (ref 1.7–7.7)
Neutrophils Relative %: 46 %
Platelets: 226 K/uL (ref 150–400)
RBC: 4.62 MIL/uL (ref 4.22–5.81)
RDW: 11.6 % (ref 11.5–15.5)
WBC: 3.4 K/uL — ABNORMAL LOW (ref 4.0–10.5)
nRBC: 0 % (ref 0.0–0.2)

## 2018-11-25 LAB — COMPREHENSIVE METABOLIC PANEL WITH GFR
ALT: 68 U/L — ABNORMAL HIGH (ref 0–44)
AST: 47 U/L — ABNORMAL HIGH (ref 15–41)
Albumin: 4.5 g/dL (ref 3.5–5.0)
Alkaline Phosphatase: 40 U/L (ref 38–126)
Anion gap: 11 (ref 5–15)
BUN: 5 mg/dL — ABNORMAL LOW (ref 6–20)
CO2: 28 mmol/L (ref 22–32)
Calcium: 9.8 mg/dL (ref 8.9–10.3)
Chloride: 98 mmol/L (ref 98–111)
Creatinine, Ser: 1.06 mg/dL (ref 0.61–1.24)
GFR calc Af Amer: >60 mL/min (ref >60)
GFR calc non Af Amer: >60 mL/min (ref >60)
Glucose, Bld: 93 mg/dL (ref 70–99)
Potassium: 4.2 mmol/L (ref 3.5–5.1)
Sodium: 137 mmol/L (ref 135–145)
Total Bilirubin: 1.3 mg/dL — ABNORMAL HIGH (ref 0.3–1.2)
Total Protein: 6.9 g/dL (ref 6.5–8.1)

## 2018-11-25 LAB — LIPASE, BLOOD: Lipase: 32 U/L (ref 11–51)

## 2018-11-25 LAB — I-STAT TROPONIN, ED: Troponin i, poc: 0 ng/mL (ref 0.00–0.08)

## 2018-11-25 MED ORDER — PANTOPRAZOLE SODIUM 40 MG PO TBEC: 40.0000 mg | DELAYED_RELEASE_TABLET | Freq: Every day | ORAL | 0 refills | Status: DC

## 2018-11-25 MED ORDER — IBUPROFEN 600 MG PO TABS: 600.0000 mg | ORAL_TABLET | Freq: Three times a day (TID) | ORAL | 0 refills | Status: AC

## 2018-11-25 MED ORDER — COLCHICINE 0.6 MG PO TABS: 0.6000 mg | ORAL_TABLET | Freq: Every day | ORAL | 0 refills | Status: DC

## 2018-11-25 MED ORDER — MIRTAZAPINE 7.5 MG PO TABS: 7.5000 mg | ORAL_TABLET | Freq: Every day | ORAL | 0 refills | Status: DC

## 2018-11-25 NOTE — ED Notes (Signed)
Patient verbalizes understanding of discharge instructions. Opportunity for questioning and answers were provided. Armband removed by staff, pt discharged from ED. Pt ambulatory to lobby.  

## 2018-11-25 NOTE — ED Notes (Signed)
ED Provider at bedside. 

## 2018-11-25 NOTE — ED Provider Notes (Signed)
[facility] EMERGENCY DEPARTMENT Provider Note   CSN: [id number] Arrival date & time: 11/25/18  1243     History   Chief Complaint Chief Complaint  Patient presents with  . Chest Pain  . Numbness    HPI Gary Boyd is a 23 y.o. male.  The history is provided by the patient and medical records. No language interpreter was used.  Chest Pain   Gary Boyd is a 23 y.o. male who presents to the Emergency Department complaining of chest pain, numbness. He has a history of reflux as well as recent diagnosis of pericarditis. He was discharged from the hospital a few days ago and was unable to fill his medication due to the pharmacy being close. He was going to pick up his prescriptions today when he noticed increased left-sided chest pain as well as cold sensation in his left arm with numbness and weakness in the left arm. He has been experiencing intermittent chest pain and left upper extremity numbness and weakness since December. Symptoms have been waxing and waning but worse with meals. He recently was admitted with diagnosis of pericarditis with an EF of 50 to 55% on his echo. He states that his symptoms were initially improved with medications in the hospital but he was unable to take them due to the pharmacy being close. He denies any fevers, shortness of breath, nausea, vomiting, leg swelling or pain. Past Medical History:  Diagnosis Date  . Allergy    seasonal  . GERD (gastroesophageal reflux disease)   . Seasonal allergies     Patient Active Problem List   Diagnosis Date Noted  . Anxiety and depression   . Pericarditis 11/19/2018  . Seasonal allergies   . Gastroesophageal reflux disease without esophagitis     History reviewed. No pertinent surgical history.      Home Medications    Prior to Admission medications   Medication Sig Start Date End Date Taking? Authorizing Provider  cetirizine (ZYRTEC) 10 MG tablet Take 10 mg by mouth daily.     [provider]  colchicine 0.6 MG tablet Take 1 tablet (0.6 mg total) by mouth daily. 11/25/18   Tilden Fossa, MD  ibuprofen (ADVIL,MOTRIN) 600 MG tablet Take 1 tablet (600 mg total) by mouth 3 (three) times daily for 14 days. 11/25/18 12/09/18  Tilden Fossa, MD  mirtazapine (REMERON) 7.5 MG tablet Take 1 tablet (7.5 mg total) by mouth at bedtime. 11/25/18   Tilden Fossa, MD  Multiple Vitamin (MULTIVITAMIN WITH MINERALS) TABS tablet Take 1 tablet by mouth daily.    [provider]  pantoprazole (PROTONIX) 40 MG tablet Take 1 tablet (40 mg total) by mouth daily. 11/25/18   Tilden Fossa, MD    Family History Family History  Problem Relation Age of Onset  . Stroke Maternal Grandmother   . Stomach cancer Neg Hx   . Esophageal cancer Neg Hx     Social History Social History   Tobacco Use  . Smoking status: Never Smoker  . Smokeless tobacco: Never Used  Substance Use Topics  . Alcohol use: Not Currently    Frequency: Never  . Drug use: No     Allergies   Lactose intolerance (gi)   Review of Systems Review of Systems  Cardiovascular: Positive for chest pain.  All other systems reviewed and are negative.    Physical Exam Updated Vital Signs BP 119/75   Pulse 90   Temp 98.2 F (36.8 C) (Oral)   Resp 18  Ht 5\' 9"  (1.753 m)   Wt 64 kg   SpO2 100%   BMI 20.82 kg/m   Physical Exam Vitals signs and nursing note reviewed.  Constitutional:      Appearance: He is well-developed.  HENT:     Head: Normocephalic and atraumatic.  Cardiovascular:     Rate and Rhythm: Normal rate and regular rhythm.     Heart sounds: No murmur.  Pulmonary:     Effort: Pulmonary effort is normal. No respiratory distress.     Breath sounds: Normal breath sounds.  Abdominal:     Palpations: Abdomen is soft.     Tenderness: There is no guarding or rebound.     Comments: Mild epigastric tenderness  Musculoskeletal:        General: No tenderness.     Comments: 2+  radial pulses bilaterally.   Skin:    General: Skin is warm and dry.     Capillary Refill: Capillary refill takes less than 2 seconds.  Neurological:     Mental Status: He is alert and oriented to person, place, and time.     Comments: 5/5 strength in BUE.  Sensation to light touch intact in BUE.  No pronator drift.    Psychiatric:        Behavior: Behavior normal.      ED Treatments / Results  Labs (all labs ordered are listed, but only abnormal results are displayed) Labs Reviewed  COMPREHENSIVE METABOLIC PANEL - Abnormal; Notable for the following components:      Result Value   BUN 5 (*)    AST 47 (*)    ALT 68 (*)    Total Bilirubin 1.3 (*)    All other components within normal limits  CBC WITH DIFFERENTIAL/PLATELET - Abnormal; Notable for the following components:   WBC 3.4 (*)    Neutro Abs 1.6 (*)    All other components within normal limits  LIPASE, BLOOD  I-STAT TROPONIN, ED    EKG EKG Interpretation  Date/Time:  Tuesday November 25 2018 12:52:56 EST Ventricular Rate:  82 PR Interval:  150 QRS Duration: 88 QT Interval:  340 QTC Calculation: 397 R Axis:   86 Text Interpretation:  Normal sinus rhythm Early repolarization Normal ECG Confirmed by Tilden Fossaees, Veona Bittman [phone]([id number]) on 11/25/2018 1:26:17 PM   Radiology No results found.  Procedures Procedures (including critical care time)  Medications Ordered in ED Medications - No data to display   Initial Impression / Assessment and Plan / ED Course  I have reviewed the triage vital signs and the nursing notes.  Pertinent labs & imaging results that were available during my care of the patient were reviewed by me and considered in my medical decision making (see chart for details).     Patient with recent hospitalization and diagnosis of pericarditis here for evaluation of increased left arm numbness and weakness as well as chest pain. EKG is similar compared to priors, possibly slightly improved. Labs without  significant abnormalities. He is neurologically intact on evaluation and well perfused. Presentation is not consistent with dissection, PE. Counseled patient on starting his prescribed medications. These were re-prescribed, as patient will not be able to go to the student pharmacy. Discussed outpatient follow-up as well as return precautions.  Final Clinical Impressions(s) / ED Diagnoses   Final diagnoses:  Paresthesia    ED Discharge Orders         Ordered    colchicine 0.6 MG tablet  Daily  11/25/18 1542    ibuprofen (ADVIL,MOTRIN) 600 MG tablet  3 times daily     11/25/18 1542    mirtazapine (REMERON) 7.5 MG tablet  Daily at bedtime     11/25/18 1542    pantoprazole (PROTONIX) 40 MG tablet  Daily     11/25/18 1542           Tilden Fossa, MD 11/25/18 1900

## 2018-11-25 NOTE — ED Triage Notes (Signed)
Pt presents to ED from [employer] due to chest pain 5/10 and left arm numbness. Pt was admitted to [facility] and d/c'ed on Friday for pericarditis. Pt went to pick up medicine today and they sent him here due to the left arm weakness/numbness. Pt endorses the chest pain and left arm weakness/numbness has been intermittent since the end of December. Pt is alert and oriented.

## 2018-11-28 NOTE — Telephone Encounter (Signed)
Spoke to patient and confirmed he picked up his prescriptions. He is feeling improvement in his symptoms with his medications. He has noted drowsiness with the Remeron and thus has stopped taking it. He is taking a leave from school to move back to [place]. I highly recommended close follow up once he is back in [place] for further evaluation to find a medication that works for him for his anxiety as well as follow-up concerning his chest pain s/p treatment. He was in agreement.

## 2018-12-03 ENCOUNTER — Encounter: Payer: Self-pay | Admitting: Physician Assistant

## 2021-06-02 ENCOUNTER — Emergency Department (HOSPITAL_COMMUNITY)
Admission: EM | Admit: 2021-06-02 | Discharge: 2021-06-02 | Disposition: A | Discharge status: Home / Self Care | Payer: No Typology Code available for payment source | Attending: Emergency Medicine | Admitting: Emergency Medicine

## 2021-06-02 ENCOUNTER — Encounter (HOSPITAL_COMMUNITY): Payer: Self-pay

## 2021-06-02 ENCOUNTER — Other Ambulatory Visit: Payer: Self-pay

## 2021-06-02 ENCOUNTER — Emergency Department (HOSPITAL_COMMUNITY): Payer: No Typology Code available for payment source

## 2021-06-02 DIAGNOSIS — Y92410 Unspecified street and highway as the place of occurrence of the external cause: Secondary | ICD-10-CM | POA: Diagnosis not present

## 2021-06-02 DIAGNOSIS — M545 Low back pain, unspecified: Secondary | ICD-10-CM | POA: Insufficient documentation

## 2021-06-02 DIAGNOSIS — M542 Cervicalgia: Secondary | ICD-10-CM | POA: Insufficient documentation

## 2021-06-02 DIAGNOSIS — M549 Dorsalgia, unspecified: Secondary | ICD-10-CM | POA: Diagnosis not present

## 2021-06-02 DIAGNOSIS — R202 Paresthesia of skin: Secondary | ICD-10-CM | POA: Diagnosis not present

## 2021-06-02 MED ORDER — NAPROXEN 375 MG PO TABS: 375.0000 mg | ORAL_TABLET | Freq: Two times a day (BID) | ORAL | 0 refills | Status: DC

## 2021-06-02 MED ORDER — ACETAMINOPHEN 325 MG PO TABS
650.0000 mg | ORAL_TABLET | Freq: Once | ORAL | Status: AC
  Administered 2021-06-02: 650 mg via ORAL
  Filled 2021-06-02: qty 2

## 2021-06-02 MED ORDER — CYCLOBENZAPRINE HCL 10 MG PO TABS: 5.0000 mg | ORAL_TABLET | Freq: Two times a day (BID) | ORAL | 0 refills | Status: DC | PRN
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
## 2021-06-02 NOTE — ED Notes (Signed)
Place C-collar on patient

## 2021-06-02 NOTE — Discharge Instructions (Addendum)

## 2021-06-14 ENCOUNTER — Emergency Department (HOSPITAL_COMMUNITY)
Admission: EM | Admit: 2021-06-14 | Discharge: 2021-06-15 | Disposition: A | Discharge status: Home / Self Care | Payer: No Typology Code available for payment source | Attending: Emergency Medicine | Admitting: Emergency Medicine

## 2021-06-14 ENCOUNTER — Other Ambulatory Visit: Payer: Self-pay

## 2021-06-14 ENCOUNTER — Ambulatory Visit (HOSPITAL_COMMUNITY)
Admission: EM | Admit: 2021-06-14 | Discharge: 2021-06-14 | Disposition: A | Discharge status: Home / Self Care | Payer: Self-pay

## 2021-06-14 ENCOUNTER — Encounter (HOSPITAL_COMMUNITY): Payer: Self-pay

## 2021-06-14 ENCOUNTER — Emergency Department (HOSPITAL_COMMUNITY): Payer: No Typology Code available for payment source

## 2021-06-14 DIAGNOSIS — R200 Anesthesia of skin: Secondary | ICD-10-CM | POA: Insufficient documentation

## 2021-06-14 DIAGNOSIS — R202 Paresthesia of skin: Secondary | ICD-10-CM | POA: Insufficient documentation

## 2021-06-14 DIAGNOSIS — Y92410 Unspecified street and highway as the place of occurrence of the external cause: Secondary | ICD-10-CM | POA: Insufficient documentation

## 2021-06-14 DIAGNOSIS — R531 Weakness: Secondary | ICD-10-CM | POA: Insufficient documentation

## 2021-06-14 DIAGNOSIS — R5383 Other fatigue: Secondary | ICD-10-CM | POA: Insufficient documentation

## 2021-06-14 DIAGNOSIS — M542 Cervicalgia: Secondary | ICD-10-CM | POA: Insufficient documentation

## 2021-06-14 DIAGNOSIS — R3981 Functional urinary incontinence: Secondary | ICD-10-CM | POA: Diagnosis not present

## 2021-06-14 DIAGNOSIS — M545 Low back pain, unspecified: Secondary | ICD-10-CM | POA: Insufficient documentation

## 2021-06-14 DIAGNOSIS — R32 Unspecified urinary incontinence: Secondary | ICD-10-CM

## 2021-06-14 DIAGNOSIS — Z5321 Procedure and treatment not carried out due to patient leaving prior to being seen by health care provider: Secondary | ICD-10-CM | POA: Diagnosis not present

## 2021-06-14 DIAGNOSIS — R519 Headache, unspecified: Secondary | ICD-10-CM

## 2021-06-14 LAB — URINALYSIS, ROUTINE W REFLEX MICROSCOPIC
Bilirubin Urine: NEGATIVE
Glucose, UA: NEGATIVE mg/dL
Hgb urine dipstick: NEGATIVE
Ketones, ur: 5 mg/dL — AB
Leukocytes,Ua: NEGATIVE
Nitrite: NEGATIVE
Protein, ur: NEGATIVE mg/dL
Specific Gravity, Urine: 1.019 (ref 1.005–1.030)
pH: 7 (ref 5.0–8.0)
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
## 2021-06-14 NOTE — ED Notes (Signed)
Pt. Asked to leave. Encouraged pt to stay. Pt left ED.
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
## 2021-06-14 NOTE — Discharge Instructions (Addendum)
Please have your friend take you directly to the emergency room
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
## 2021-06-15 ENCOUNTER — Emergency Department (HOSPITAL_BASED_OUTPATIENT_CLINIC_OR_DEPARTMENT_OTHER)
Admission: EM | Admit: 2021-06-15 | Discharge: 2021-06-15 | Disposition: A | Discharge status: Home / Self Care | Payer: No Typology Code available for payment source | Source: Home / Self Care | Attending: Emergency Medicine | Admitting: Emergency Medicine

## 2021-06-15 ENCOUNTER — Emergency Department (HOSPITAL_BASED_OUTPATIENT_CLINIC_OR_DEPARTMENT_OTHER): Payer: No Typology Code available for payment source

## 2021-06-15 ENCOUNTER — Other Ambulatory Visit: Payer: Self-pay

## 2021-06-15 ENCOUNTER — Encounter (HOSPITAL_BASED_OUTPATIENT_CLINIC_OR_DEPARTMENT_OTHER): Payer: Self-pay | Admitting: Emergency Medicine

## 2021-06-15 DIAGNOSIS — R5383 Other fatigue: Secondary | ICD-10-CM | POA: Insufficient documentation

## 2021-06-15 DIAGNOSIS — R531 Weakness: Secondary | ICD-10-CM | POA: Insufficient documentation

## 2021-06-15 DIAGNOSIS — R200 Anesthesia of skin: Secondary | ICD-10-CM | POA: Insufficient documentation

## 2021-06-15 DIAGNOSIS — R202 Paresthesia of skin: Secondary | ICD-10-CM

## 2021-06-15 LAB — URINALYSIS, ROUTINE W REFLEX MICROSCOPIC
Bilirubin Urine: NEGATIVE
Glucose, UA: NEGATIVE mg/dL
Hgb urine dipstick: NEGATIVE
Ketones, ur: 15 mg/dL — AB
Leukocytes,Ua: NEGATIVE
Nitrite: NEGATIVE
Protein, ur: NEGATIVE mg/dL
Specific Gravity, Urine: 1.019 (ref 1.005–1.030)
pH: 6.5 (ref 5.0–8.0)

## 2021-06-15 LAB — RAPID URINE DRUG SCREEN, HOSP PERFORMED
Amphetamines: NOT DETECTED
Barbiturates: NOT DETECTED
Benzodiazepines: NOT DETECTED
Cocaine: NOT DETECTED
Opiates: NOT DETECTED
Tetrahydrocannabinol: NOT DETECTED
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
## 2023-04-15 ENCOUNTER — Encounter (HOSPITAL_BASED_OUTPATIENT_CLINIC_OR_DEPARTMENT_OTHER): Payer: Self-pay | Admitting: Emergency Medicine

## 2023-04-15 ENCOUNTER — Emergency Department (HOSPITAL_BASED_OUTPATIENT_CLINIC_OR_DEPARTMENT_OTHER)
Admission: EM | Admit: 2023-04-15 | Discharge: 2023-04-15 | Disposition: A | Discharge status: Home / Self Care | Payer: Self-pay | Attending: Emergency Medicine | Admitting: Emergency Medicine

## 2023-04-15 ENCOUNTER — Other Ambulatory Visit: Payer: Self-pay

## 2023-04-15 ENCOUNTER — Emergency Department (HOSPITAL_BASED_OUTPATIENT_CLINIC_OR_DEPARTMENT_OTHER): Payer: Self-pay

## 2023-04-15 DIAGNOSIS — G43809 Other migraine, not intractable, without status migrainosus: Secondary | ICD-10-CM | POA: Insufficient documentation

## 2023-04-15 LAB — CBC WITH DIFFERENTIAL/PLATELET
Abs Immature Granulocytes: 0.01 10*3/uL (ref 0.00–0.07)
Basophils Absolute: 0 10*3/uL (ref 0.0–0.1)
Basophils Relative: 1 %
Eosinophils Absolute: 0.1 10*3/uL (ref 0.0–0.5)
Eosinophils Relative: 2 %
HCT: 43.7 % (ref 39.0–52.0)
Hemoglobin: 14.6 g/dL (ref 13.0–17.0)
Immature Granulocytes: 0 %
Lymphocytes Relative: 40 %
Lymphs Abs: 1.4 10*3/uL (ref 0.7–4.0)
MCH: 29.7 pg (ref 26.0–34.0)
MCHC: 33.4 g/dL (ref 30.0–36.0)
MCV: 89 fL (ref 80.0–100.0)
Monocytes Absolute: 0.4 10*3/uL (ref 0.1–1.0)
Monocytes Relative: 11 %
Neutro Abs: 1.7 10*3/uL (ref 1.7–7.7)
Neutrophils Relative %: 46 %
Platelets: 253 10*3/uL (ref 150–400)
RBC: 4.91 MIL/uL (ref 4.22–5.81)
RDW: 12.1 % (ref 11.5–15.5)
WBC: 3.5 10*3/uL — ABNORMAL LOW (ref 4.0–10.5)
nRBC: 0 % (ref 0.0–0.2)

## 2023-04-15 LAB — COMPREHENSIVE METABOLIC PANEL
ALT: 38 U/L (ref 0–44)
AST: 35 U/L (ref 15–41)
Albumin: 4.7 g/dL (ref 3.5–5.0)
Alkaline Phosphatase: 58 U/L (ref 38–126)
Anion gap: 15 (ref 5–15)
BUN: 11 mg/dL (ref 6–20)
CO2: 20 mmol/L — ABNORMAL LOW (ref 22–32)
Calcium: 9 mg/dL (ref 8.9–10.3)
Chloride: 99 mmol/L (ref 98–111)
Creatinine, Ser: 1.1 mg/dL (ref 0.61–1.24)
GFR, Estimated: >60 mL/min (ref >60)
Glucose, Bld: 72 mg/dL (ref 70–99)
Potassium: 3.3 mmol/L — ABNORMAL LOW (ref 3.5–5.1)
Sodium: 134 mmol/L — ABNORMAL LOW (ref 135–145)
Total Bilirubin: 1.7 mg/dL — ABNORMAL HIGH (ref 0.3–1.2)
Total Protein: 8.3 g/dL — ABNORMAL HIGH (ref 6.5–8.1)

## 2023-04-15 NOTE — Discharge Instructions (Addendum)
    Your lab work and imaging are unremarkable today.  Head CT does not show any brain bleed or brain mass.    I do recommend 1000 mg of Tylenol every 6 hours as needed for headache if you are able to tolerate it.  You can also take 400 mg ibuprofen every 8 hours as needed.  Follow-up with neurology and ophthalmology.

## 2023-04-15 NOTE — ED Provider Triage Note (Signed)
Emergency Medicine Provider Triage Evaluation Note  Gary Boyd , a 27 y.o. male  was evaluated in triage.  Pt complains of headache. Onset over 1 year ago, told optic nerve inflammation and treated with steroid drops which caused abdominal discomfort and swelling so stopped the drops. For the past 5 days has had HA worsening with eating, feels like circulation loss to hands so has stopped eating.  Review of Systems  Positive: HA Negative: fever  Physical Exam  Ht 5\' 7"  (1.702 m)   Wt 70.3 kg   BMI 24.28 kg/m  Gen:   Awake, no distress   Resp:  Normal effort  MSK:   Moves extremities without difficulty  Other:    Medical Decision Making  Medically screening exam initiated at 4:40 PM.  Appropriate orders placed.  Gary Boyd was informed that the remainder of the evaluation will be completed by another provider, this initial triage assessment does not replace that evaluation, and the importance of remaining in the ED until their evaluation is complete.     Gary Fend, PA-C 04/15/23 [phone]

## 2023-04-15 NOTE — ED Notes (Signed)
Pt came to the ED today reporting headache, R eye pain, "optic nerve inflammation," fatigue, SHOB, and palpitations during physical activity. Pt also states he is unable to eat because "it decreases the circulation in his hands" and reports the veins turn blue afterwards. Pt states he has not ate in 5 days except "a few bites." Pt states he tried steroid eye drops "a year and a half ago" and still feels jittery and anxious from them. Pt states he has been unable to obtain an ophthalmology appt.

## 2023-04-15 NOTE — ED Triage Notes (Signed)
Patient arrives ambulatory by POV c/o headache and pressure behind right eye. Patient states he has inflammation from his optic nerve. States he has been dealing with this on and off for past 4 years and doctors just give him tylenol. Wears glasses.

## 2023-04-15 NOTE — ED Provider Notes (Signed)
[facility] Provider Note   CSN: [id number] Arrival date & time: 04/15/23  1623     History  Chief Complaint  Patient presents with   Headache    Gary Boyd is a 27 y.o. male.  Patient here with right-sided headache.  On and off for several days.  He is having some pain around the right eye/right side of the head but no vision loss or pain with eye movement.  Feels like it is hard to eat and drink when he has headaches.  He denies any numbness or weakness or tingling.  History of some paresthesias and tingling in the past.  He is not having a lot of symptoms at this time.  He denies any fevers or chills.  Denies any chest pain or shortness of breath.  No recent surgery or travel.  The history is provided by the patient.       Home Medications Prior to Admission medications   Medication Sig Start Date End Date Taking? Authorizing Provider  cetirizine (ZYRTEC) 10 MG tablet Take 10 mg by mouth daily.    [provider]  colchicine 0.6 MG tablet Take 1 tablet (0.6 mg total) by mouth daily. 11/25/18   Tilden Fossa, MD  cyclobenzaprine (FLEXERIL) 10 MG tablet Take 0.5-1 tablets (5-10 mg total) by mouth 2 (two) times daily as needed for muscle spasms. 06/02/21   Harris, Cammy Copa, PA-C  mirtazapine (REMERON) 7.5 MG tablet Take 1 tablet (7.5 mg total) by mouth at bedtime. 11/25/18   Tilden Fossa, MD  Multiple Vitamin (MULTIVITAMIN WITH MINERALS) TABS tablet Take 1 tablet by mouth daily.    [provider]  naproxen (NAPROSYN) 375 MG tablet Take 1 tablet (375 mg total) by mouth 2 (two) times daily with a meal. 06/02/21   Harris, Abigail, PA-C  pantoprazole (PROTONIX) 40 MG tablet Take 1 tablet (40 mg total) by mouth daily. 11/25/18   Tilden Fossa, MD      Allergies    Lactose intolerance (gi)    Review of Systems   Review of Systems  Physical Exam Updated Vital Signs BP (!) 111/95 (BP Location: Left Arm)    Pulse (!) 116   Temp 99.2 F (37.3 C)   Resp 18   Ht 5\' 7"  (1.702 m)   Wt 70.3 kg   SpO2 99%   BMI 24.28 kg/m  Physical Exam Vitals and nursing note reviewed.  Constitutional:      General: He is not in acute distress.    Appearance: He is well-developed. He is not ill-appearing.  HENT:     Head: Normocephalic and atraumatic.  Eyes:     Extraocular Movements: Extraocular movements intact.     Right eye: Normal extraocular motion and no nystagmus.     Left eye: Normal extraocular motion and no nystagmus.     Conjunctiva/sclera: Conjunctivae normal.     Pupils: Pupils are equal, round, and reactive to light.  Cardiovascular:     Rate and Rhythm: Normal rate and regular rhythm.     Heart sounds: No murmur heard. Pulmonary:     Effort: Pulmonary effort is normal. No respiratory distress.     Breath sounds: Normal breath sounds.  Abdominal:     Palpations: Abdomen is soft.     Tenderness: There is no abdominal tenderness.  Musculoskeletal:        General: No swelling. Normal range of motion.     Cervical back: Normal range of motion  and neck supple. No rigidity.  Lymphadenopathy:     Cervical: No cervical adenopathy.  Skin:    General: Skin is warm and dry.     Capillary Refill: Capillary refill takes less than 2 seconds.  Neurological:     Mental Status: He is alert and oriented to person, place, and time.     Comments: 5+ out of 5 strength throughout, normal sensation, normal visual fields, normal visual acuity, normal extraocular movements, normal finger-nose-finger, normal speech  Psychiatric:        Mood and Affect: Mood normal.     ED Results / Procedures / Treatments   Labs (all labs ordered are listed, but only abnormal results are displayed) Labs Reviewed  COMPREHENSIVE METABOLIC PANEL - Abnormal; Notable for the following components:      Result Value   Sodium 134 (*)    Potassium 3.3 (*)    CO2 20 (*)    Total Protein 8.3 (*)    Total Bilirubin 1.7 (*)     All other components within normal limits  CBC WITH DIFFERENTIAL/PLATELET - Abnormal; Notable for the following components:   WBC 3.5 (*)    All other components within normal limits    EKG None  Radiology CT Head Wo Contrast  Result Date: 04/15/2023 CLINICAL DATA:  Headache.  Increasing frequency of headaches. EXAM: CT HEAD WITHOUT CONTRAST TECHNIQUE: Contiguous axial images were obtained from the base of the skull through the vertex without intravenous contrast. RADIATION DOSE REDUCTION: This exam was performed according to the departmental dose-optimization program which includes automated exposure control, adjustment of the mA and/or kV according to patient size and/or use of iterative reconstruction technique. COMPARISON:  None Available. FINDINGS: Brain: No acute intracranial hemorrhage. No focal mass lesion. No CT evidence of acute infarction. No midline shift or mass effect. No hydrocephalus. Basilar cisterns are patent. Vascular: No hyperdense vessel or unexpected calcification. Skull: Normal. Negative for fracture or focal lesion. Sinuses/Orbits: Paranasal sinuses and mastoid air cells are clear. Orbits are clear. Other: None. IMPRESSION: Normal head CT. Electronically Signed   By: Genevive Bi M.D.   On: 04/15/2023 18:40    Procedures Procedures    Medications Ordered in ED Medications - No data to display  ED Course/ Medical Decision Making/ A&P                             Medical Decision Making Amount and/or Complexity of Data Reviewed Radiology: ordered.   Gary Boyd is here with headache.  Normal vitals.  No fever.  Well-appearing.  Normal neurological exam.  Differential diagnosis likely migraine.  I have no concern for stroke.  He is having pain over the right temporal area.  He has no abnormality on eye exam.  He has equal movement of his eyes, no redness or irritation.  Normal visual fields and normal visual acuity.  He does not have any eye tenderness or  pain in the eye.  He is concerned that he might have inflammation of his eye although clinically I do not think that this is evident.  He has somewhat of a flat a fact.  He had a CBC and CMP and head CT that were unremarkable per my review and interpretation.  Radiology report also agree with head CT report.  Per chart review he has a history of paresthesias.  Not sure if this is possibly MS type process.  But he does not have any  symptoms to suggest acute MS.  Ultimately will refer to ophthalmology for dilated eye exam to further evaluate for any ocular issues and will refer him to neurology for further migraine/headache workup.  He declined any medication for headache at this time.  Discharged in good condition.  This chart was dictated using voice recognition software.  Despite best efforts to proofread,  errors can occur which can change the documentation meaning.         Final Clinical Impression(s) / ED Diagnoses Final diagnoses:  Other migraine without status migrainosus, not intractable    Rx / DC Orders ED Discharge Orders          Ordered    Ambulatory referral to Neurology       Comments: An appointment is requested in approximately: 1 week   04/15/23 1824              Virgina Norfolk, DO 04/15/23 [id number]

## 2023-04-24 ENCOUNTER — Encounter: Payer: Self-pay | Admitting: Neurology

## 2023-04-24 ENCOUNTER — Ambulatory Visit (INDEPENDENT_AMBULATORY_CARE_PROVIDER_SITE_OTHER): Payer: Self-pay | Admitting: Neurology

## 2023-04-24 VITALS — BP 109/69 | HR 93 | Ht 67.0 in | Wt 148.0 lb

## 2023-04-24 DIAGNOSIS — G43711 Chronic migraine without aura, intractable, with status migrainosus: Secondary | ICD-10-CM

## 2023-04-24 MED ORDER — RIZATRIPTAN BENZOATE 10 MG PO TBDP: 10.0000 mg | ORAL_TABLET | ORAL | 11 refills | Status: AC | PRN

## 2023-04-24 MED ORDER — AMITRIPTYLINE HCL 25 MG PO TABS: 25.0000 mg | ORAL_TABLET | Freq: Every day | ORAL | 11 refills | Status: AC

## 2023-04-24 MED ORDER — AMITRIPTYLINE HCL 25 MG PO TABS: 25.0000 mg | ORAL_TABLET | Freq: Every day | ORAL | 11 refills | Status: DC

## 2023-04-24 MED ORDER — RIZATRIPTAN BENZOATE 10 MG PO TBDP: 10.0000 mg | ORAL_TABLET | ORAL | 11 refills | Status: DC | PRN

## 2023-04-24 NOTE — Patient Instructions (Addendum)
Prevention: Start amitriptyline if it doesn't work then go to Topiramate: 50mg  and then increase to 100mg  at bedtime Acute/emergent: Rizatriptan: Please take one tablet at the onset of your headache. If it does not improve the symptoms please take one additional tablet. Do not take more then 2 tablets in 24hrs. Do not take use more then 2 to 3 times in a week.   Rizatriptan Disintegrating Tablets What is this medication? RIZATRIPTAN (rye za TRIP tan) treats migraines. It works by blocking pain signals and narrowing blood vessels in the brain. It belongs to a group of medications called triptans. It is not used to prevent migraines. This medicine may be used for other purposes; ask your health care provider or pharmacist if you have questions. COMMON BRAND NAME(S): Maxalt-MLT What should I tell my care team before I take this medication? They need to know if you have any of these conditions: Circulation problems in fingers and toes Diabetes Heart disease High blood pressure High cholesterol History of irregular heartbeat History of stroke Stomach or intestine problems Tobacco use An unusual or allergic reaction to rizatriptan, other medications, foods, dyes, or preservatives Pregnant or trying to get pregnant Breast-feeding How should I use this medication? Take this medication by mouth. Take it as directed on the prescription label. You do not need water to take this medication. Leave the tablet in the sealed pack until you are ready to take it. With dry hands, open the pack and gently remove the tablet. If the tablet breaks or crumbles, throw it away. Use a new tablet. Place the tablet on the tongue and allow it to dissolve. Then, swallow it. Do not cut, crush, or chew this medication. Do not use it more often than directed. Talk to your care team about the use of this medication in children. While it may be prescribed for children as young as 6 years for selected conditions, precautions do  apply. Overdosage: If you think you have taken too much of this medicine contact a poison control center or emergency room at once. NOTE: This medicine is only for you. Do not share this medicine with others. What if I miss a dose? This does not apply. This medication is not for regular use. What may interact with this medication? Do not take this medication with any of the following: Ergot alkaloids, such as dihydroergotamine, ergotamine MAOIs, such as Marplan, Nardil, Parnate Other medications for migraine headache, such as almotriptan, eletriptan, frovatriptan, naratriptan, sumatriptan, zolmitriptan This medication may also interact with the following: Certain medications for depression, anxiety, or other mental health conditions Propranolol This list may not describe all possible interactions. Give your health care provider a list of all the medicines, herbs, non-prescription drugs, or dietary supplements you use. Also tell them if you smoke, drink alcohol, or use illegal drugs. Some items may interact with your medicine. What should I watch for while using this medication? Visit your care team for regular checks on your progress. Tell your care team if your symptoms do not start to get better or if they get worse. This medication may affect your coordination, reaction time, or judgment. Do not drive or operate machinery until you know how this medication affects you. Sit up or stand slowly to reduce the risk of dizzy or fainting spells. If you take migraine medications for 10 or more days a month, your migraines may get worse. Keep a diary of headache days and medication use. Contact your care team if your migraine attacks occur  more frequently. What side effects may I notice from receiving this medication? Side effects that you should report to your care team as soon as possible: Allergic reactions--skin rash, itching, hives, swelling of the face, lips, tongue, or throat Burning, pain,  tingling, or color changes in the hands, arms, legs, or feet Heart attack--pain or tightness in the chest, shoulders, arms, or jaw, nausea, shortness of breath, cold or clammy skin, feeling faint or lightheaded Heart rhythm changes--fast or irregular heartbeat, dizziness, feeling faint or lightheaded, chest pain, trouble breathing Increase in blood pressure Irritability, confusion, fast or irregular heartbeat, muscle stiffness, twitching muscles, sweating, high fever, seizure, chills, vomiting, diarrhea, which may be signs of serotonin syndrome Raynaud syndrome--cool, numb, or painful fingers or toes that may change color from pale, to blue, to red Seizures Stroke--sudden numbness or weakness of the face, arm, or leg, trouble speaking, confusion, trouble walking, loss of balance or coordination, dizziness, severe headache, change in vision Sudden or severe stomach pain, bloody diarrhea, fever, nausea, vomiting Vision loss Side effects that usually do not require medical attention (report to your care team if they continue or are bothersome): Dizziness Unusual weakness or fatigue This list may not describe all possible side effects. Call your doctor for medical advice about side effects. You may report side effects to FDA at 1-800-FDA-1088. Where should I keep my medication? Keep out of the reach of children and pets. Store at room temperature between 15 and 30 degrees C (59 and 86 degrees F). Protect from light and moisture. Get rid of any unused medication after the expiration date. To get rid of medications that are no longer needed or have expired: Take the medication to a medication take-back program. Check with your pharmacy or law enforcement to find a location. If you cannot return the medication, check the label or package insert to see if the medication should be thrown out in the garbage or flushed down the toilet. If you are not sure, ask your care team. If it is safe to put it in the  trash, empty the medication out of the container. Mix the medication with cat litter, dirt, coffee grounds, or other unwanted substance. Seal the mixture in a bag or container. Put it in the trash. NOTE: This sheet is a summary. It may not cover all possible information. If you have questions about this medicine, talk to your doctor, pharmacist, or health care provider.  2024 Else[employer]/Gold Standard (2022-02-22 00:00:00)  Amitriptyline Tablets What is this medication? AMITRIPTYLINE (a mee TRIP ti leen) treats depression. It increases the amount of serotonin and norepinephrine in the brain, hormones that help regulate mood. It belongs to a group of medications called tricyclic antidepressants (TCAs). This medicine may be used for other purposes; ask your health care provider or pharmacist if you have questions. COMMON BRAND NAME(S): Elavil, Vanatrip What should I tell my care team before I take this medication? They need to know if you have any of these conditions: Asthma, trouble breathing Bipolar disorder or schizophrenia Difficulty passing urine, prostate trouble Frequently drink alcohol Glaucoma Heart disease or previous heart attack Liver disease Seizures Suicidal thoughts, plans, or attempt by you or a family member Thyroid disease An unusual or allergic reaction to amitriptyline, other medications, foods, dyes, or preservatives Pregnant or trying to get pregnant Breastfeeding How should I use this medication? Take this medication by mouth with a drink of water. Follow the directions on the prescription label. You can take the tablets with or without  food. Take your medication at regular intervals. Do not take it more often than directed. Do not stop taking this medication suddenly except upon the advice of your care team. Stopping this medication too quickly may cause serious side effects or your condition may worsen. A special MedGuide will be given to you by the pharmacist with each  prescription and refill. Be sure to read this information carefully each time. Talk to your care team regarding the use of this medication in children. Special care may be needed. Overdosage: If you think you have taken too much of this medicine contact a poison control center or emergency room at once. NOTE: This medicine is only for you. Do not share this medicine with others. What if I miss a dose? If you miss a dose, take it as soon as you can. If it is almost time for your next dose, take only that dose. Do not take double or extra doses. What may interact with this medication? Do not take this medication with any of the following: Arsenic trioxide Certain medications used to regulate abnormal heartbeat or to treat other heart conditions Cisapride Droperidol Halofantrine Linezolid MAOIs like Carbex, Eldepryl, Marplan, Nardil, and Parnate Methylene blue Other medications for mental depression Phenothiazines like perphenazine, thioridazine and chlorpromazine Pimozide Probucol Procarbazine Sparfloxacin St. John's Wort This medication may also interact with the following: Atropine and related medications like hyoscyamine, scopolamine, tolterodine and others Barbiturate medications for inducing sleep or treating seizures, like phenobarbital Cimetidine Disulfiram Ethchlorvynol Thyroid hormones such as levothyroxine Ziprasidone This list may not describe all possible interactions. Give your health care provider a list of all the medicines, herbs, non-prescription drugs, or dietary supplements you use. Also tell them if you smoke, drink alcohol, or use illegal drugs. Some items may interact with your medicine. What should I watch for while using this medication? Visit your care team for regular checks on your progress. It may take several weeks to see the full effects of this medication, and it is important to continue your treatment as prescribed by your care team. Tell your care team  if your symptoms do not get better or if they get worse. Patients and their families should watch out for new or worsening thoughts of suicide or depression. Also watch out for sudden changes in feelings such as feeling anxious, agitated, panicky, irritable, hostile, aggressive, impulsive, severely restless, overly excited and hyperactive, or not being able to sleep. If this happens, especially at the beginning of treatment or after a change in dose, call your care team. This medication may affect your coordination, reaction time, or judgment. Do not drive or operate machinery until you know how this medication affects you. Sit up or stand slowly to reduce the risk of dizzy or fainting spells. Drinking alcohol with this medication can increase the risk of these side effects. Do not treat yourself for coughs, colds, or allergies while you are taking this medication without asking your care team for advice. Some ingredients can increase possible side effects. Your mouth may get dry. Chewing sugarless gum or sucking hard candy and drinking plenty of water may help. Contact your care team if the problem does not go away or is severe. This medication may cause dry eyes and blurred vision. If you wear contact lenses, you may feel some discomfort. Lubricating eye drops may help. See your care team if the problem does not go away or is severe. This medication can cause constipation. If you do not have a bowel  movement for 3 days, call your care team. This medication can make you more sensitive to the sun. Keep out of the sun. If you cannot avoid being in the sun, wear protective clothing and sunscreen. Do not use sun lamps, tanning beds, or tanning booths. What side effects may I notice from receiving this medication? Side effects that you should report to your care team as soon as possible: Allergic reactions--skin rash, itching, hives, swelling of the face, lips, tongue, or throat Heart rhythm changes-- fast or  irregular heartbeat, dizziness, feeling faint or lightheaded, chest pain, trouble breathing Serotonin syndrome--irritability, confusion, fast or irregular heartbeat, muscle stiffness, twitching muscles, sweating, high fever, seizure, chills, vomiting, diarrhea Sudden eye pain or change in vision such as blurry vision, seeing halos around lights, vision loss Thoughts of suicide or self-harm, worsening mood, feelings of depression Side effects that usually do not require medical attention (report to your care team if they continue or are bothersome): Change in appetite or weight Change in sex drive or performance Constipation Dizziness Drowsiness Dry mouth Tremors This list may not describe all possible side effects. Call your doctor for medical advice about side effects. You may report side effects to FDA at 1-800-FDA-1088. Where should I keep my medication? Keep out of the reach of children and pets. Store at room temperature between 20 and 25 degrees C (68 and 77 degrees F). Throw away any unused medication after the expiration date. NOTE: This sheet is a summary. It may not cover all possible information. If you have questions about this medicine, talk to your doctor, pharmacist, or health care provider.  2024 Elsevier/Gold Standard (2022-07-05 00:00:00)  Topiramate Tablets What is this medication? TOPIRAMATE (toe PYRE a mate) prevents and controls seizures in people with epilepsy. It may also be used to prevent migraine headaches. It works by calming overactive nerves in your body. This medicine may be used for other purposes; ask your health care provider or pharmacist if you have questions. COMMON BRAND NAME(S): Topamax, Topiragen What should I tell my care team before I take this medication? They need to know if you have any of these conditions: Bleeding disorder Kidney disease Lung disease Suicidal thoughts, plans, or attempt by you or a family member An unusual or allergic  reaction to topiramate, other medications, foods, dyes, or preservatives Pregnant or trying to get pregnant Breast-feeding How should I use this medication? Take this medication by mouth with water. Take it as directed on the prescription label at the same time every day. Do not cut, crush or chew this medicine. Swallow the tablets whole. You can take it with or without food. If it upsets your stomach, take it with food. Keep taking it unless your care team tells you to stop. A special MedGuide will be given to you by the pharmacist with each prescription and refill. Be sure to read this information carefully each time. Talk to your care team about the use of this medication in children. While it may be prescribed for children as young as 2 years for selected conditions, precautions do apply. Overdosage: If you think you have taken too much of this medicine contact a poison control center or emergency room at once. NOTE: This medicine is only for you. Do not share this medicine with others. What if I miss a dose? If you miss a dose, take it as soon as you can unless it is within 6 hours of the next dose. If it is within 6 hours of  the next dose, skip the missed dose. Take the next dose at the normal time. Do not take double or extra doses. What may interact with this medication? Acetazolamide Alcohol Antihistamines for allergy, cough, and cold Aspirin and aspirin-like medications Atropine Certain medications for anxiety or sleep Certain medications for bladder problems, such as oxybutynin, tolterodine Certain medications for depression, such as amitriptyline, fluoxetine, sertraline Certain medications for Parkinson disease, such as benztropine, trihexyphenidyl Certain medications for seizures, such as carbamazepine, lamotrigine, phenobarbital, phenytoin, primidone, valproic acid, zonisamide Certain medications for stomach problems, such as dicyclomine, hyoscyamine Certain medications for  travel sickness, such as scopolamine Certain medications that treat or prevent blood clots, such as warfarin, enoxaparin, dalteparin, apixaban, dabigatran, rivaroxaban Digoxin Diltiazem Estrogen and progestin hormones General anesthetics, such as halothane, isoflurane, methoxyflurane, propofol Glyburide Hydrochlorothiazide Ipratropium Lithium Medications that relax muscles Metformin NSAIDs, medications for pain and inflammation, such as ibuprofen or naproxen Opioid medications for pain Phenothiazines, such as chlorpromazine, mesoridazine, prochlorperazine, thioridazine Pioglitazone This list may not describe all possible interactions. Give your health care provider a list of all the medicines, herbs, non-prescription drugs, or dietary supplements you use. Also tell them if you smoke, drink alcohol, or use illegal drugs. Some items may interact with your medicine. What should I watch for while using this medication? Visit your care team for regular checks on your progress. Tell your care team if your symptoms do not start to get better or if they get worse. Do not suddenly stop taking this medication. You may develop a severe reaction. Your care team will tell you how much medication to take. If your care team wants you to stop the medication, the dose may be slowly lowered over time to avoid any side effects. Wear a medical ID bracelet or chain. Carry a card that describes your condition. List the medications and doses you take on the card. This medication may affect your coordination, reaction time, or judgment. Do not drive or operate machinery until you know how this medication affects you. Sit up or stand slowly to reduce the risk of dizzy or fainting spells. Drinking alcohol with this medication can increase the risk of these side effects. This medication may cause serious skin reactions. They can happen weeks to months after starting the medication. Contact your care team right away if  you notice fevers or flu-like symptoms with a rash. The rash may be red or purple and then turn into blisters or peeling of the skin. You may also notice a red rash with swelling of the face, lips, or lymph nodes in your neck or under your arms. This medication may cause thoughts of suicide or depression. This includes sudden changes in mood, behaviors, or thoughts. These changes can happen at any time but are more common in the beginning of treatment or after a change in dose. Call your care team right away if you experience these thoughts or worsening depression. This medication may slow your child's growth if it is taken for a long time at high doses. Your child's care team will monitor your child's growth. Using this medication for a long time may weaken your bones. The risk of bone fractures may be increased. Talk to your care team about your bone health. Discuss this medication with your care team if you may be pregnant. Serious birth defects can occur if you take this medication during pregnancy. There are benefits and risks to taking medications during pregnancy. Your care team can help you find the option that  works for you. Contraception is recommended while taking this medication. Estrogen and progestin hormones may not work as well while you are taking this medication. Your care team can help you find the option that works for you. Talk to your care team before breastfeeding. Changes to your treatment plan may be needed. What side effects may I notice from receiving this medication? Side effects that you should report to your care team as soon as possible: Allergic reactions--skin rash, itching, hives, swelling of the face, lips, tongue, or throat High acid level--trouble breathing, unusual weakness or fatigue, confusion, headache, fast or irregular heartbeat, nausea, vomiting High ammonia level--unusual weakness or fatigue, confusion, loss of appetite, nausea, vomiting, seizures Fever that  does not go away, decrease in sweat Kidney stones--blood in the urine, pain or trouble passing urine, pain in the lower back or sides Redness, blistering, peeling or loosening of the skin, including inside the mouth Sudden eye pain or change in vision such as blurry vision, seeing halos around lights, vision loss Thoughts of suicide or self-harm, worsening mood, feelings of depression Side effects that usually do not require medical attention (report to your care team if they continue or are bothersome): Burning or tingling sensation in hands or feet Difficulty with paying attention, memory, or speech Dizziness Drowsiness Fatigue Loss of appetite with weight loss Slow or sluggish movements of the body This list may not describe all possible side effects. Call your doctor for medical advice about side effects. You may report side effects to FDA at 1-800-FDA-1088. Where should I keep my medication? Keep out of the reach of children and pets. Store between 15 and 30 degrees C (59 and 86 degrees F). Protect from moisture. Keep the container tightly closed. Get rid of any unused medication after the expiration date. To get rid of medications that are no longer needed or have expired: Take the medication to a medication take-back program. Check with your pharmacy or law enforcement to find a location. If you cannot return the medication, check the label or package insert to see if the medication should be thrown out in the garbage or flushed down the toilet. If you are not sure, ask your care team. If it is safe to put it in the trash, empty the medication out of the container. Mix the medication with cat litter, dirt, coffee grounds, or other unwanted substance. Seal the mixture in a bag or container. Put it in the trash. NOTE: This sheet is a summary. It may not cover all possible information. If you have questions about this medicine, talk to your doctor, pharmacist, or health care provider.  2024  [employer]vier/Gold Standard (2022-03-15 00:00:00)
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
## 2023-04-25 ENCOUNTER — Encounter: Payer: Self-pay | Admitting: Neurology

## 2023-04-25 DIAGNOSIS — G43711 Chronic migraine without aura, intractable, with status migrainosus: Secondary | ICD-10-CM | POA: Insufficient documentation

## 2023-05-21 ENCOUNTER — Other Ambulatory Visit: Payer: Self-pay | Admitting: Neurology

## 2023-05-21 DIAGNOSIS — G43711 Chronic migraine without aura, intractable, with status migrainosus: Secondary | ICD-10-CM
